# Patient Record
Sex: Male | Born: 1975 | Race: Black or African American | Hispanic: No
Health system: Southern US, Community
[De-identification: ages and names within clinical notes are randomized; demographics above are authoritative.]

## PROBLEM LIST (undated history)

## (undated) DIAGNOSIS — Z720 Tobacco use: Secondary | ICD-10-CM

## (undated) DIAGNOSIS — J45909 Unspecified asthma, uncomplicated: Secondary | ICD-10-CM

## (undated) DIAGNOSIS — I509 Heart failure, unspecified: Secondary | ICD-10-CM

---
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
## 2004-10-29 ENCOUNTER — Emergency Department (HOSPITAL_COMMUNITY): Admission: EM | Admit: 2004-10-29 | Discharge: 2004-10-29 | Payer: Self-pay | Admitting: Family Medicine

## 2005-03-16 ENCOUNTER — Emergency Department (HOSPITAL_COMMUNITY): Admission: EM | Admit: 2005-03-16 | Discharge: 2005-03-16 | Payer: Self-pay | Admitting: Family Medicine

## 2005-04-28 ENCOUNTER — Encounter: Admission: RE | Admit: 2005-04-28 | Discharge: 2005-04-28 | Payer: Self-pay | Admitting: Occupational Medicine

## 2019-08-18 ENCOUNTER — Ambulatory Visit: Payer: Self-pay | Attending: Internal Medicine

## 2019-08-18 DIAGNOSIS — Z20822 Contact with and (suspected) exposure to covid-19: Secondary | ICD-10-CM | POA: Insufficient documentation

## 2019-08-19 LAB — NOVEL CORONAVIRUS, NAA: SARS-CoV-2, NAA: NOT DETECTED

## 2019-12-27 ENCOUNTER — Other Ambulatory Visit: Payer: Self-pay

## 2019-12-27 ENCOUNTER — Encounter (HOSPITAL_COMMUNITY): Payer: Self-pay

## 2019-12-27 ENCOUNTER — Inpatient Hospital Stay (HOSPITAL_COMMUNITY)
Admission: AD | Admit: 2019-12-27 | Discharge: 2019-12-30 | DRG: 871 | Disposition: A | Discharge status: Home / Self Care | Payer: 59 | Attending: Internal Medicine | Admitting: Internal Medicine

## 2019-12-27 ENCOUNTER — Emergency Department (HOSPITAL_COMMUNITY): Payer: 59

## 2019-12-27 DIAGNOSIS — R61 Generalized hyperhidrosis: Secondary | ICD-10-CM | POA: Diagnosis present

## 2019-12-27 DIAGNOSIS — Z20822 Contact with and (suspected) exposure to covid-19: Secondary | ICD-10-CM | POA: Diagnosis present

## 2019-12-27 DIAGNOSIS — I10 Essential (primary) hypertension: Secondary | ICD-10-CM | POA: Diagnosis not present

## 2019-12-27 DIAGNOSIS — J189 Pneumonia, unspecified organism: Secondary | ICD-10-CM | POA: Diagnosis present

## 2019-12-27 DIAGNOSIS — Z8249 Family history of ischemic heart disease and other diseases of the circulatory system: Secondary | ICD-10-CM

## 2019-12-27 DIAGNOSIS — F17210 Nicotine dependence, cigarettes, uncomplicated: Secondary | ICD-10-CM | POA: Diagnosis present

## 2019-12-27 DIAGNOSIS — R0602 Shortness of breath: Secondary | ICD-10-CM

## 2019-12-27 DIAGNOSIS — J45901 Unspecified asthma with (acute) exacerbation: Secondary | ICD-10-CM | POA: Diagnosis present

## 2019-12-27 DIAGNOSIS — Z7289 Other problems related to lifestyle: Secondary | ICD-10-CM | POA: Diagnosis not present

## 2019-12-27 DIAGNOSIS — I361 Nonrheumatic tricuspid (valve) insufficiency: Secondary | ICD-10-CM | POA: Diagnosis not present

## 2019-12-27 DIAGNOSIS — R030 Elevated blood-pressure reading, without diagnosis of hypertension: Secondary | ICD-10-CM | POA: Diagnosis present

## 2019-12-27 DIAGNOSIS — I428 Other cardiomyopathies: Secondary | ICD-10-CM | POA: Diagnosis present

## 2019-12-27 DIAGNOSIS — Z825 Family history of asthma and other chronic lower respiratory diseases: Secondary | ICD-10-CM | POA: Diagnosis not present

## 2019-12-27 DIAGNOSIS — I5042 Chronic combined systolic (congestive) and diastolic (congestive) heart failure: Secondary | ICD-10-CM

## 2019-12-27 DIAGNOSIS — I5041 Acute combined systolic (congestive) and diastolic (congestive) heart failure: Secondary | ICD-10-CM | POA: Diagnosis present

## 2019-12-27 DIAGNOSIS — I351 Nonrheumatic aortic (valve) insufficiency: Secondary | ICD-10-CM | POA: Diagnosis not present

## 2019-12-27 DIAGNOSIS — E785 Hyperlipidemia, unspecified: Secondary | ICD-10-CM | POA: Diagnosis present

## 2019-12-27 DIAGNOSIS — J45909 Unspecified asthma, uncomplicated: Secondary | ICD-10-CM

## 2019-12-27 DIAGNOSIS — A419 Sepsis, unspecified organism: Secondary | ICD-10-CM | POA: Diagnosis present

## 2019-12-27 HISTORY — DX: Unspecified asthma, uncomplicated: J45.909

## 2019-12-27 LAB — CBC WITH DIFFERENTIAL/PLATELET
Abs Immature Granulocytes: 0.04 10*3/uL (ref 0.00–0.07)
Basophils Absolute: 0 10*3/uL (ref 0.0–0.1)
Basophils Relative: 0 %
Eosinophils Absolute: 0.1 10*3/uL (ref 0.0–0.5)
Eosinophils Relative: 1 %
HCT: 43.6 % (ref 39.0–52.0)
Hemoglobin: 14.9 g/dL (ref 13.0–17.0)
Immature Granulocytes: 0 %
Lymphocytes Relative: 11 %
Lymphs Abs: 1.2 10*3/uL (ref 0.7–4.0)
MCH: 32.1 pg (ref 26.0–34.0)
MCHC: 34.2 g/dL (ref 30.0–36.0)
MCV: 94 fL (ref 80.0–100.0)
Monocytes Absolute: 0.9 10*3/uL (ref 0.1–1.0)
Monocytes Relative: 8 %
Neutro Abs: 9 10*3/uL — ABNORMAL HIGH (ref 1.7–7.7)
Neutrophils Relative %: 80 %
Platelets: 195 10*3/uL (ref 150–400)
RBC: 4.64 MIL/uL (ref 4.22–5.81)
RDW: 11.7 % (ref 11.5–15.5)
WBC: 11.4 10*3/uL — ABNORMAL HIGH (ref 4.0–10.5)
nRBC: 0 % (ref 0.0–0.2)

## 2019-12-27 LAB — C-REACTIVE PROTEIN: CRP: 9.2 mg/dL — ABNORMAL HIGH (ref <1.0)

## 2019-12-27 LAB — BRAIN NATRIURETIC PEPTIDE: B Natriuretic Peptide: 175.3 pg/mL — ABNORMAL HIGH (ref 0.0–100.0)

## 2019-12-27 LAB — RAPID URINE DRUG SCREEN, HOSP PERFORMED
Amphetamines: NOT DETECTED
Barbiturates: NOT DETECTED
Benzodiazepines: NOT DETECTED
Cocaine: NOT DETECTED
Opiates: NOT DETECTED
Tetrahydrocannabinol: NOT DETECTED

## 2019-12-27 LAB — COMPREHENSIVE METABOLIC PANEL
ALT: 29 U/L (ref 0–44)
AST: 22 U/L (ref 15–41)
Albumin: 4.1 g/dL (ref 3.5–5.0)
Alkaline Phosphatase: 50 U/L (ref 38–126)
Anion gap: 10 (ref 5–15)
BUN: 12 mg/dL (ref 6–20)
CO2: 22 mmol/L (ref 22–32)
Calcium: 8.7 mg/dL — ABNORMAL LOW (ref 8.9–10.3)
Chloride: 101 mmol/L (ref 98–111)
Creatinine, Ser: 0.99 mg/dL (ref 0.61–1.24)
GFR calc Af Amer: >60 mL/min (ref >60)
GFR calc non Af Amer: >60 mL/min (ref >60)
Glucose, Bld: 108 mg/dL — ABNORMAL HIGH (ref 70–99)
Potassium: 3.8 mmol/L (ref 3.5–5.1)
Sodium: 133 mmol/L — ABNORMAL LOW (ref 135–145)
Total Bilirubin: 1.2 mg/dL (ref 0.3–1.2)
Total Protein: 7.6 g/dL (ref 6.5–8.1)

## 2019-12-27 LAB — HIV ANTIBODY (ROUTINE TESTING W REFLEX): HIV Screen 4th Generation wRfx: NONREACTIVE

## 2019-12-27 LAB — LACTIC ACID, PLASMA
Lactic Acid, Venous: 1.1 mmol/L (ref 0.5–1.9)
Lactic Acid, Venous: 0.9 mmol/L (ref 0.5–1.9)

## 2019-12-27 LAB — SARS CORONAVIRUS 2 BY RT PCR (HOSPITAL ORDER, PERFORMED IN ~~LOC~~ HOSPITAL LAB): SARS Coronavirus 2: NEGATIVE

## 2019-12-27 LAB — FERRITIN: Ferritin: 327 ng/mL (ref 24–336)

## 2019-12-27 LAB — LACTATE DEHYDROGENASE: LDH: 163 U/L (ref 98–192)

## 2019-12-27 LAB — PROTIME-INR
INR: 1.1 (ref 0.8–1.2)
Prothrombin Time: 13.6 seconds (ref 11.4–15.2)

## 2019-12-27 LAB — PROCALCITONIN: Procalcitonin: 0.12 ng/mL

## 2019-12-27 LAB — FIBRINOGEN: Fibrinogen: 477 mg/dL — ABNORMAL HIGH (ref 210–475)

## 2019-12-27 LAB — TRIGLYCERIDES: Triglycerides: 70 mg/dL (ref <150)

## 2019-12-27 LAB — D-DIMER, QUANTITATIVE: D-Dimer, Quant: 0.37 ug/mL-FEU (ref 0.00–0.50)

## 2019-12-27 MED ORDER — ACETAMINOPHEN 500 MG PO TABS
1000.0000 mg | ORAL_TABLET | Freq: Once | ORAL | Status: AC
  Administered 2019-12-27: 1000 mg via ORAL
  Filled 2019-12-27: qty 2

## 2019-12-27 MED ORDER — ALBUTEROL SULFATE (2.5 MG/3ML) 0.083% IN NEBU
2.5000 mg | INHALATION_SOLUTION | Freq: Four times a day (QID) | RESPIRATORY_TRACT | Status: DC
  Administered 2019-12-27 – 2019-12-28 (×4): 2.5 mg via RESPIRATORY_TRACT
  Filled 2019-12-27 (×4): qty 3

## 2019-12-27 MED ORDER — LACTATED RINGERS IV SOLN: INTRAVENOUS | Status: DC

## 2019-12-27 MED ORDER — ENOXAPARIN SODIUM 40 MG/0.4ML ~~LOC~~ SOLN
40.0000 mg | SUBCUTANEOUS | Status: DC
  Administered 2019-12-27 – 2019-12-29 (×3): 40 mg via SUBCUTANEOUS
  Filled 2019-12-27 (×3): qty 0.4

## 2019-12-27 MED ORDER — SODIUM CHLORIDE 0.9 % IV SOLN
1.0000 g | INTRAVENOUS | Status: DC
  Administered 2019-12-28 – 2019-12-30 (×3): 1 g via INTRAVENOUS
  Filled 2019-12-27 (×3): qty 1

## 2019-12-27 MED ORDER — SODIUM CHLORIDE 0.9 % IV SOLN
1.0000 g | Freq: Once | INTRAVENOUS | Status: AC
  Administered 2019-12-27: 1 g via INTRAVENOUS
  Filled 2019-12-27: qty 10

## 2019-12-27 MED ORDER — ALBUTEROL SULFATE HFA 108 (90 BASE) MCG/ACT IN AERS
4.0000 | INHALATION_SPRAY | Freq: Once | RESPIRATORY_TRACT | Status: AC
Start: 2019-12-27 — End: 2019-12-27
  Administered 2019-12-27: 4 via RESPIRATORY_TRACT
  Filled 2019-12-27: qty 6.7

## 2019-12-27 MED ORDER — PREDNISONE 20 MG PO TABS
60.0000 mg | ORAL_TABLET | Freq: Once | ORAL | Status: AC
  Administered 2019-12-27: 60 mg via ORAL
  Filled 2019-12-27: qty 3

## 2019-12-27 MED ORDER — PREDNISONE 20 MG PO TABS: 60.0000 mg | ORAL_TABLET | Freq: Every day | ORAL | Status: DC

## 2019-12-27 MED ORDER — CHLORHEXIDINE GLUCONATE CLOTH 2 % EX PADS
6.0000 | MEDICATED_PAD | Freq: Every day | CUTANEOUS | Status: DC
  Administered 2019-12-30: 6 via TOPICAL

## 2019-12-27 MED ORDER — SODIUM CHLORIDE 0.9 % IV SOLN
500.0000 mg | INTRAVENOUS | Status: DC
  Filled 2019-12-27: qty 500

## 2019-12-27 MED ORDER — SODIUM CHLORIDE 0.9 % IV SOLN
500.0000 mg | Freq: Once | INTRAVENOUS | Status: AC
  Administered 2019-12-27: 500 mg via INTRAVENOUS
  Filled 2019-12-27: qty 500

## 2019-12-27 MED ORDER — PREDNISONE 20 MG PO TABS
40.0000 mg | ORAL_TABLET | Freq: Every day | ORAL | Status: DC
  Administered 2019-12-28: 40 mg via ORAL
  Filled 2019-12-27: qty 2

## 2019-12-27 NOTE — ED Notes (Signed)
ED TO INPATIENT HANDOFF REPORT  ED Nurse Name and Phone #: [phone]  S Name/Age/Gender Alex Lee 44 y.o. male Room/Bed: WA25/WA25  Code Status   Code Status: Not on file  Home/SNF/Other Home Patient oriented to: self, place, time and situation Is this baseline? Yes   Triage Complete: Triage complete  Chief Complaint Community acquired pneumonia [J18.9]  Triage Note Pt reports a cough that started the day before yesterday. He states that it is causing his asthma to "act up." Also reports chest pain and states that if he lays on his R side, he has increased difficulty breathing.     Allergies No Known Allergies  Level of Care/Admitting Diagnosis ED Disposition    ED Disposition Condition Comment   Admit  Hospital Area: [facility] [[id number]]  Level of Care: Stepdown [14]  Admit to SDU based on following criteria: Respiratory Distress:  Frequent assessment and/or intervention to maintain adequate ventilation/respiration, pulmonary toilet, and respiratory treatment.  May [facility] patient to [facility] or [facility] if equivalent level of care is available:: Yes  Covid Evaluation: Confirmed COVID Negative  Admission Type: Urgent [2]  Diagnosis: Community acquired pneumonia [967591]  Admitting Physician: Sueanne Margarita [[id number]]  Attending Physician: Sueanne Margarita [[id number]]  Estimated length of stay: past midnight tomorrow  Certification:: I certify this patient will need inpatient services for at least 2 midnights       B Medical/Surgery History History reviewed. No pertinent past medical history.    A IV Location/Drains/Wounds Patient Lines/Drains/Airways Status    Active Line/Drains/Airways    Name Placement date Placement time Site Days   Peripheral IV 12/27/19 Left Antecubital 12/27/19  0850  Antecubital  less than 1   Peripheral IV 12/27/19 Left Hand 12/27/19  0857  Hand  less than 1          Intake/Output Last 24 hours No intake or  output data in the 24 hours ending 12/27/19 1411  Labs/Imaging Results for orders placed or performed during the hospital encounter of 12/27/19 (from the past 48 hour(s))  SARS Coronavirus 2 by RT PCR (hospital order, performed in [place] hospital lab) Nasopharyngeal Nasopharyngeal Swab     Status: None   Collection Time: 12/27/19  8:46 AM   Specimen: Nasopharyngeal Swab  Result Value Ref Range   SARS Coronavirus 2 NEGATIVE NEGATIVE    Comment: (NOTE) SARS-CoV-2 target nucleic acids are NOT DETECTED.  The SARS-CoV-2 RNA is generally detectable in upper and lower respiratory specimens during the acute phase of infection. The lowest concentration of SARS-CoV-2 viral copies this assay can detect is 250 copies / mL. A negative result does not preclude SARS-CoV-2 infection and should not be used as the sole basis for treatment or other patient management decisions.  A negative result may occur with improper specimen collection / handling, submission of specimen other than nasopharyngeal swab, presence of viral mutation(s) within the areas targeted by this assay, and inadequate number of viral copies (<250 copies / mL). A negative result must be combined with clinical observations, patient history, and epidemiological information.  Fact Sheet for Patients:   StrictlyIdeas.no  Fact Sheet for Healthcare Providers: BankingDealers.co.za  This test is not yet approved or  cleared by the [place] FDA and has been authorized for detection and/or diagnosis of SARS-CoV-2 by FDA under an Emergency Use Authorization (EUA).  This EUA will remain in effect (meaning this test can be used) for the duration of the COVID-19 declaration under Section 564(b)(1) of  the Act, 21 U.S.C. section 360bbb-3(b)(1), unless the authorization is terminated or revoked sooner.  Performed at [facility], [address], [place], [place]  [address]   Lactic acid, plasma     Status: None   Collection Time: 12/27/19  8:46 AM  Result Value Ref Range   Lactic Acid, Venous 1.1 0.5 - 1.9 mmol/L    Comment: Performed at [facility], [address], [place], [place] [address]  CBC WITH DIFFERENTIAL     Status: Abnormal   Collection Time: 12/27/19  8:46 AM  Result Value Ref Range   WBC 11.4 (H) 4.0 - 10.5 K/uL   RBC 4.64 4.22 - 5.81 MIL/uL   Hemoglobin 14.9 13.0 - 17.0 g/dL   HCT 50.0 39 - 52 %   MCV 94.0 80.0 - 100.0 fL   MCH 32.1 26.0 - 34.0 pg   MCHC 34.2 30.0 - 36.0 g/dL   RDW 93.8 18.2 - 99.3 %   Platelets 195 150 - 400 K/uL   nRBC 0.0 0.0 - 0.2 %   Neutrophils Relative % 80 %   Neutro Abs 9.0 (H) 1.7 - 7.7 K/uL   Lymphocytes Relative 11 %   Lymphs Abs 1.2 0.7 - 4.0 K/uL   Monocytes Relative 8 %   Monocytes Absolute 0.9 0 - 1 K/uL   Eosinophils Relative 1 %   Eosinophils Absolute 0.1 0 - 0 K/uL   Basophils Relative 0 %   Basophils Absolute 0.0 0 - 0 K/uL   Immature Granulocytes 0 %   Abs Immature Granulocytes 0.04 0.00 - 0.07 K/uL    Comment: Performed at [facility], [address], [place], [place] [address]  Comprehensive metabolic panel     Status: Abnormal   Collection Time: 12/27/19  8:46 AM  Result Value Ref Range   Sodium 133 (L) 135 - 145 mmol/L   Potassium 3.8 3.5 - 5.1 mmol/L   Chloride 101 98 - 111 mmol/L   CO2 22 22 - 32 mmol/L   Glucose, Bld 108 (H) 70 - 99 mg/dL    Comment: Glucose reference range applies only to samples taken after fasting for at least 8 hours.   BUN 12 6 - 20 mg/dL   Creatinine, Ser 7.89 0.61 - 1.24 mg/dL   Calcium 8.7 (L) 8.9 - 10.3 mg/dL   Total Protein 7.6 6.5 - 8.1 g/dL   Albumin 4.1 3.5 - 5.0 g/dL   AST 22 15 - 41 U/L   ALT 29 0 - 44 U/L   Alkaline Phosphatase 50 38 - 126 U/L   Total Bilirubin 1.2 0.3 - 1.2 mg/dL   GFR calc non Af Amer >60 >60 mL/min   GFR calc Af Amer >60 >60 mL/min   Anion gap 10 5 - 15    Comment: Performed  at [facility], [address] [place], [place] [address]  D-dimer, quantitative     Status: None   Collection Time: 12/27/19  8:46 AM  Result Value Ref Range   D-Dimer, Quant 0.37 0.00 - 0.50 ug/mL-FEU    Comment: (NOTE) At the manufacturer cut-off of 0.50 ug/mL FEU, this assay has been documented to exclude PE with a sensitivity and negative predictive value of 97 to 99%.  At this time, this assay has not been approved by the FDA to exclude DVT/VTE. Results should be correlated with clinical presentation. Performed at [facility], [address] [place], [place] [address]  Procalcitonin     Status: None   Collection Time: 12/27/19  8:46 AM  Result Value Ref Range   Procalcitonin 0.12 ng/mL    Comment:        Interpretation: PCT (Procalcitonin) <= 0.5 ng/mL: Systemic infection (sepsis) is not likely. Local bacterial infection is possible. (NOTE)       Sepsis PCT Algorithm           Lower Respiratory Tract                                      Infection PCT Algorithm    ----------------------------     ----------------------------         PCT < 0.25 ng/mL                PCT < 0.10 ng/mL          Strongly encourage             Strongly discourage   discontinuation of antibiotics    initiation of antibiotics    ----------------------------     -----------------------------       PCT 0.25 - 0.50 ng/mL            PCT 0.10 - 0.25 ng/mL               OR       >80% decrease in PCT            Discourage initiation of                                            antibiotics      Encourage discontinuation           of antibiotics    ----------------------------     -----------------------------         PCT >= 0.50 ng/mL              PCT 0.26 - 0.50 ng/mL               AND        <80% decrease in PCT             Encourage initiation of                                             antibiotics       Encourage continuation           of  antibiotics    ----------------------------     -----------------------------        PCT >= 0.50 ng/mL                  PCT > 0.50 ng/mL               AND         increase in PCT                  Strongly encourage                                      initiation  of antibiotics    Strongly encourage escalation           of antibiotics                                     -----------------------------                                           PCT <= 0.25 ng/mL                                                 OR                                        > 80% decrease in PCT                                      Discontinue / Do not initiate                                             antibiotics  Performed at [facility], [address] [place], [place] [address]   Lactate dehydrogenase     Status: None   Collection Time: 12/27/19  8:46 AM  Result Value Ref Range   LDH 163 98 - 192 U/L    Comment: Performed at [facility], [address], [place], [place] [address]  Triglycerides     Status: None   Collection Time: 12/27/19  8:46 AM  Result Value Ref Range   Triglycerides 70 <150 mg/dL    Comment: Performed at [facility], [address], [place], [place] [address]  Fibrinogen     Status: Abnormal   Collection Time: 12/27/19  8:46 AM  Result Value Ref Range   Fibrinogen 477 (H) 210 - 475 mg/dL    Comment: Performed at [facility], [address] [place], [place] [address]  Lactic acid, plasma     Status: None   Collection Time: 12/27/19 10:13 AM  Result Value Ref Range   Lactic Acid, Venous 0.9 0.5 - 1.9 mmol/L    Comment: Performed at [facility], [address] [place], [place] [address]   DG Chest 2 View  Result Date: 12/27/2019 CLINICAL DATA:  Short of breath.  Cough EXAM: CHEST - 2 VIEW COMPARISON:  None. FINDINGS: Normal cardiac silhouette. There is diffuse airspace density  in the LEFT upper lobe. RIGHT lung clear. Lung bases clear. IMPRESSION: Concern for LEFT upper lobe pneumonia versus asymmetric pulmonary edema. Electronically Signed   By: Genevive Bi M.D.   On: 12/27/2019 08:11    Pending Labs Unresulted Labs (From admission, onward) Comment          Start     Ordered   12/27/19 1257  Brain natriuretic peptide  Add-on,   AD  12/27/19 1256   12/27/19 1239  Expectorated sputum assessment w rflx to resp cult  Once,   R        12/27/19 1238   12/27/19 0813  Blood Culture (routine x 2)  BLOOD CULTURE X 2,   STAT      12/27/19 0813   12/27/19 0813  Ferritin  Once,   STAT        12/27/19 0813   12/27/19 0813  C-reactive protein  Once,   STAT        12/27/19 0813   Signed and Held  HIV Antibody (routine testing w rflx)  (HIV Antibody (Routine testing w reflex) panel)  Once,   R        Signed and Held   Signed and Held  Urine rapid drug screen (hosp performed)  ONCE - STAT,   R        Signed and Held   Signed and Held  Protime-INR  Once,   R        Signed and Held   Signed and Held  Influenza panel by PCR (type A & B)  (Influenza PCR Panel)  Once,   R        Signed and Held          Vitals/Pain Today's Vitals   12/27/19 1147 12/27/19 1202 12/27/19 1217 12/27/19 1232  BP:  (!) 141/110    Pulse: (!) 106 (!) 104 (!) 108 (!) 105  Resp: (!) 24 20 16  (!) 23  Temp:      TempSrc:      SpO2: 96% 96% 96% 97%    Isolation Precautions Airborne and Contact precautions  Medications Medications  albuterol (VENTOLIN HFA) 108 (90 Base) MCG/ACT inhaler 4 puff (4 puffs Inhalation Given 12/27/19 0906)  acetaminophen (TYLENOL) tablet 1,000 mg (1,000 mg Oral Given 12/27/19 0906)  cefTRIAXone (ROCEPHIN) 1 g in sodium chloride 0.9 % 100 mL IVPB (0 g Intravenous Stopped 12/27/19 1058)  azithromycin (ZITHROMAX) 500 mg in sodium chloride 0.9 % 250 mL IVPB (0 mg Intravenous Stopped 12/27/19 1232)  predniSONE (DELTASONE) tablet 60 mg (60 mg Oral Given 12/27/19  1101)    Mobility walks Low fall risk   Focused Assessments .   R Recommendations: See Admitting Provider Note  Report given to:   Additional Notes: n/a

## 2019-12-27 NOTE — H&P (Signed)
History and Physical    Alex Lee WER:[id number] DOB: [birth year] DOA: 12/27/2019  PCP: Patient, No Pcp Per has not established but will move to [employer] around [place] and estbalish with someone Patient coming from: home, lives with wife  Chief Complaint: SOB  HPI: Alex Lee is a 44 y.o. male with a pertinent history of childhood asthma who presents to [facility] ED with shortness of breath.  Pt states that for the past 2 days he has been ahving shortness of breath, yesterday felt like his previous childhood asthma.  He was so SOB he called EMS and they provided a breathing treatment which made him feel better for a short period of time but it returned and caused him to present to the ED.  Associated symptoms include fever.    SH - patient works on [employer] but denies any known exposures to cause this.  Denies any sick contacts.  He still smokes.  Denies any other family history of lung or liver diseases like alpha1antitrypsin  In the ED, he was tachycardic especially with ambulation, tachypneic, febrile, wbc 11.4, Na 133, Scr 0.99, LFT's okay, procal 1.1, LA 1.1, covid negative.  CXR with LUL opacity concerning for PNA.  CXR shows Concern for LEFT upper lobe pneumonia versus asymmetric pulmonary edema. ekg started on prednisone 60mg , azithromycin and ceftriaxone and albuterol which the ED provider thinks helped some.  s/p blood cultures  Talked with wife in the room.  has not been vaccinated against covid.  Review of Systems: As per HPI otherwise 10 point review of systems negative.  Other pertinents as below:  General - denies any new HA's or visual changes, weight change HEENT - Cardio -  Denies any CP or palpitations Resp - denies any sick contacts, denies orthopnea or LE edema GI - denies any n/v/d/GI pain GU - denies urinary changes MSK - denies MSK changes Skin - denies new skin changes or infections Neuro - no new numbness or weakness Psych - denies any new anxiety or  depression  Past Medical History:  Diagnosis Date  . Childhood asthma   . Tobacco abuse     History reviewed. No pertinent surgical history.   reports that he has been smoking. He does not have any smokeless tobacco history on file. He reports previous drug use. No history on file for alcohol use.  No Known Allergies  History reviewed. No pertinent family history. Has FH of asthma  Prior to Admission medications   Medication Sig Start Date End Date Taking? Authorizing Provider  cephALEXin (KEFLEX) 250 MG capsule Take 250 mg by mouth in the morning and at bedtime. 12/10/19  Yes [provider]    Physical Exam: Vitals:   12/27/19 1500 12/27/19 1524 12/27/19 1600 12/27/19 2000  BP: (!) 171/122  (!) 168/113   Pulse: 97  98   Resp: 20  (!) 22   Temp:   98.8 F (37.1 C) 98.5 F (36.9 C)  TempSrc:   Oral Oral  SpO2: 97% 97% 96%     Constitutional: NAD, comfortable, a little diaphoretic Eyes: pupils equal and reactive to light, anicteric, without injection ENMT: MMM, throat without exudates or erythema Neck: normal, supple, no masses, no thyromegaly noted Respiratory: LUL diminished breath sounds, increased work of breathing some, some wheezing heard Cardiovascular: rrr, well perfused in the extremities Abdomen: NBS, NT,  Obese some Musculoskeletal: moving all 4 extremities, strength grossly intact 5/5 in the UE and LE's Skin: no rashes, lesions, ulcers. No induration Neurologic:  CN 2-12 grossly intact. Sensation intact Psychiatric: AO appearing, mentation appropriate  Labs on Admission: I have personally reviewed following labs and imaging studies  CBC: Recent Labs  Lab 12/27/19 0846  WBC 11.4*  NEUTROABS 9.0*  HGB 14.9  HCT 43.6  MCV 94.0  PLT 195   Basic Metabolic Panel: Recent Labs  Lab 12/27/19 0846  NA 133*  K 3.8  CL 101  CO2 22  GLUCOSE 108*  BUN 12  CREATININE 0.99  CALCIUM 8.7*   GFR: CrCl cannot be calculated (Unknown ideal  weight.). Liver Function Tests: Recent Labs  Lab 12/27/19 0846  AST 22  ALT 29  ALKPHOS 50  BILITOT 1.2  PROT 7.6  ALBUMIN 4.1   No results for input(s): LIPASE, AMYLASE in the last 168 hours. No results for input(s): AMMONIA in the last 168 hours. Coagulation Profile: Recent Labs  Lab 12/27/19 1527  INR 1.1   Cardiac Enzymes: No results for input(s): CKTOTAL, CKMB, CKMBINDEX, TROPONINI in the last 168 hours. BNP (last 3 results) No results for input(s): PROBNP in the last 8760 hours. HbA1C: No results for input(s): HGBA1C in the last 72 hours. CBG: No results for input(s): GLUCAP in the last 168 hours. Lipid Profile: Recent Labs    12/27/19 0846  TRIG 70   Thyroid Function Tests: No results for input(s): TSH, T4TOTAL, FREET4, T3FREE, THYROIDAB in the last 72 hours. Anemia Panel: Recent Labs    12/27/19 0846  FERRITIN 327   Urine analysis: No results found for: COLORURINE, APPEARANCEUR, LABSPEC, PHURINE, GLUCOSEU, HGBUR, BILIRUBINUR, KETONESUR, PROTEINUR, UROBILINOGEN, NITRITE, LEUKOCYTESUR  Radiological Exams on Admission: DG Chest 2 View  Result Date: 12/27/2019 CLINICAL DATA:  Short of breath.  Cough EXAM: CHEST - 2 VIEW COMPARISON:  None. FINDINGS: Normal cardiac silhouette. There is diffuse airspace density in the LEFT upper lobe. RIGHT lung clear. Lung bases clear. IMPRESSION: Concern for LEFT upper lobe pneumonia versus asymmetric pulmonary edema. Electronically Signed   By: Genevive Bi M.D.   On: 12/27/2019 08:11    EKG: Independently reviewed.   Assessment/Plan Active Problems:   Childhood asthma   Community acquired pneumonia   Sepsis, fever, tachycardia, leukocytosis, increased respiratory rate to 31 from a CAP with possibly associated asthma exacerbation.  Doubt COVID given 3 days of symptoms and negative PCR test.  Albuterol helped some.   DDX: vocal cord dysfunctoin/spasm? cbc with diff --PE? ARDS?, consider if no improvement.  Will  look for RH strain on echo as below, but really doubt --Continue azithromycin and ceftriaxone, QTc is 452 --started on prednisone 40mg , continue for 5 days --scheduled albuterol for every 6 hours --UDS ordered, negative, cocaine (he denies) --follow up cultures  --LR 108mL/hr for 14 hours, to end ~4AM --test for flu --pericardial effusion, not on bedside ultrasound but with pwave elevation and BNP to 175 will get an echocardiogram.  No notable JVD  Elevated blood pressure, has not established with a PCP, could be hypertension, consider starting amlodipine or HCTZ on discharge.  If no thoughts for pulmonary edema  Patient and/or Family completely agreed with the plan, expressed understanding and I answered all questions.  DVT prophylaxis: Lovenox SQ Code Status: Full code Family Communication: Mrs. Doleman in the room Disposition Plan: home when better on oral abx probably  Consults called: n/a Admission status: Probably can move to floor tomorrow, stepdown currently because of severe findings with his PNA and initial appearance.   A total of 80 minutes utilized during this admission.  72m  [place] DO [facility]   If 7PM-7AM, please contact night-coverage [email or web address] Password [facility]  12/27/2019, 8:14 PM

## 2019-12-27 NOTE — ED Triage Notes (Signed)
Pt reports a cough that started the day before yesterday. He states that it is causing his asthma to "act up." Also reports chest pain and states that if he lays on his R side, he has increased difficulty breathing.

## 2019-12-27 NOTE — ED Provider Notes (Signed)
[facility] Note   CSN: [id number] Arrival date & time: 12/27/19  5809     History Chief Complaint  Patient presents with  . Cough  . Chest Pain    Alex Lee is a 43 y.o. male.  Patient is a 44 year old male who presents with cough and shortness of breath.  He reports a 2-day history of runny nose congestion and coughing.  His cough is mostly nonproductive.  He has had some associated shortness of breath.  He has a remote history of asthma when he was a child but has not had issues since that time.  He has been feeling wheezy and short of breath since yesterday.  He does have some pain across the center of his chest.  Its otherwise nonradiating.  He says it is worse with coughing.  He has had some fevers that started yesterday.  No vomiting or diarrhea.  No leg swelling.  No known Covid exposures.  He has not been vaccinated for Covid.  He said that EMS came out yesterday to his house and gave him a breathing treatment and he felt much better and did not want to be transported at that time.  However he has no inhalers or any other medications to use for his asthma at home as he has not had issues with it since he was a child.        History reviewed. No pertinent past medical history.  Patient Active Problem List   Diagnosis Date Noted  . Childhood asthma 12/27/2019  . Community acquired pneumonia 12/27/2019     The histories are not reviewed yet. Please review them in the "History" navigator section and refresh this SmartLink.     History reviewed. No pertinent family history.  Social History   Tobacco Use  . Smoking status: Not on file  Substance Use Topics  . Alcohol use: Not on file  . Drug use: Not on file    Home Medications Prior to Admission medications   Medication Sig Start Date End Date Taking? Authorizing Provider  cephALEXin (KEFLEX) 250 MG capsule Take 250 mg by mouth in the morning and at bedtime. 12/10/19   Yes [provider]    Allergies    Patient has no known allergies.  Review of Systems   Review of Systems  Constitutional: Positive for fatigue. Negative for chills, diaphoresis and fever.  HENT: Positive for congestion and rhinorrhea. Negative for sneezing.   Eyes: Negative.   Respiratory: Positive for cough, chest tightness and shortness of breath.   Cardiovascular: Negative for chest pain and leg swelling.  Gastrointestinal: Negative for abdominal pain, blood in stool, diarrhea, nausea and vomiting.  Genitourinary: Negative for difficulty urinating, flank pain, frequency and hematuria.  Musculoskeletal: Negative for arthralgias and back pain.  Skin: Negative for rash.  Neurological: Negative for dizziness, speech difficulty, weakness, numbness and headaches.    Physical Exam Updated Vital Signs BP (!) 141/110   Pulse (!) 105   Temp 99 F (37.2 C) (Oral)   Resp (!) 23   SpO2 97%   Physical Exam Constitutional:      Appearance: He is well-developed.  HENT:     Head: Normocephalic and atraumatic.  Eyes:     Pupils: Pupils are equal, round, and reactive to light.  Cardiovascular:     Rate and Rhythm: Regular rhythm. Tachycardia present.     Heart sounds: Normal heart sounds.  Pulmonary:     Effort: Pulmonary effort is  normal. No respiratory distress.     Breath sounds: Wheezing and rhonchi present. No rales.  Chest:     Chest wall: No tenderness.  Abdominal:     General: Bowel sounds are normal.     Palpations: Abdomen is soft.     Tenderness: There is no abdominal tenderness. There is no guarding or rebound.  Musculoskeletal:        General: Normal range of motion.     Cervical back: Normal range of motion and neck supple.     Comments: No edema or calf tenderness  Lymphadenopathy:     Cervical: No cervical adenopathy.  Skin:    General: Skin is warm and dry.     Findings: No rash.  Neurological:     Mental Status: He is alert and oriented to  person, place, and time.     ED Results / Procedures / Treatments   Labs (all labs ordered are listed, but only abnormal results are displayed) Labs Reviewed  CBC WITH DIFFERENTIAL/PLATELET - Abnormal; Notable for the following components:      Result Value   WBC 11.4 (*)    Neutro Abs 9.0 (*)    All other components within normal limits  COMPREHENSIVE METABOLIC PANEL - Abnormal; Notable for the following components:   Sodium 133 (*)    Glucose, Bld 108 (*)    Calcium 8.7 (*)    All other components within normal limits  FIBRINOGEN - Abnormal; Notable for the following components:   Fibrinogen 477 (*)    All other components within normal limits  SARS CORONAVIRUS 2 BY RT PCR (HOSPITAL ORDER, PERFORMED IN [facility])  CULTURE, BLOOD (ROUTINE X 2)  CULTURE, BLOOD (ROUTINE X 2)  EXPECTORATED SPUTUM ASSESSMENT W REFEX TO RESP CULTURE  LACTIC ACID, PLASMA  D-DIMER, QUANTITATIVE (NOT AT [facility])  PROCALCITONIN  LACTATE DEHYDROGENASE  TRIGLYCERIDES  LACTIC ACID, PLASMA  FERRITIN  C-REACTIVE PROTEIN  BRAIN NATRIURETIC PEPTIDE    EKG EKG Interpretation  Date/Time:  Saturday December 27 2019 06:54:47 EDT Ventricular Rate:  112 PR Interval:    QRS Duration: 91 QT Interval:  331 QTC Calculation: 452 R Axis:   -55 Text Interpretation: Sinus tachycardia Probable left atrial enlargement Left anterior fascicular block RSR' in V1 or V2, right VCD or RVH Probable left ventricular hypertrophy Borderline T abnormalities, lateral leads ST elev, probable normal early repol pattern 12 Lead; Mason-Likar No old tracing to compare Confirmed by Rolan Bucco [phone]) on 12/27/2019 7:46:00 AM   Radiology DG Chest 2 View  Result Date: 12/27/2019 CLINICAL DATA:  Short of breath.  Cough EXAM: CHEST - 2 VIEW COMPARISON:  None. FINDINGS: Normal cardiac silhouette. There is diffuse airspace density in the LEFT upper lobe. RIGHT lung clear. Lung bases clear. IMPRESSION: Concern for LEFT upper  lobe pneumonia versus asymmetric pulmonary edema. Electronically Signed   By: Genevive Bi M.D.   On: 12/27/2019 08:11    Procedures Procedures (including critical care time)  Medications Ordered in ED Medications  albuterol (VENTOLIN HFA) 108 (90 Base) MCG/ACT inhaler 4 puff (4 puffs Inhalation Given 12/27/19 0906)  acetaminophen (TYLENOL) tablet 1,000 mg (1,000 mg Oral Given 12/27/19 0906)  cefTRIAXone (ROCEPHIN) 1 g in sodium chloride 0.9 % 100 mL IVPB (0 g Intravenous Stopped 12/27/19 1058)  azithromycin (ZITHROMAX) 500 mg in sodium chloride 0.9 % 250 mL IVPB (0 mg Intravenous Stopped 12/27/19 1232)  predniSONE (DELTASONE) tablet 60 mg (60 mg Oral Given 12/27/19 1101)  ED Course  I have reviewed the triage vital signs and the nursing notes.  Pertinent labs & imaging results that were available during my care of the patient were reviewed by me and considered in my medical decision making (see chart for details).    MDM Rules/Calculators/A&P                          Patient is a 44 year old male who presents with cough, wheezing and shortness of breath.  He has a remote history of asthma.  He is noted to be tachycardic with some mild increased work of breathing.  No hypoxia.  Chest x-ray shows left upper lobe pneumonia.  His Covid test is negative.  His other Covid markers are negative.  He remains mildly tachycardic although he feels better.  When he ambulates his heart rate goes up into the 120s and his sats dropped down to around 91.  He still has some wheezing and has some improvement with albuterol.  I feel his symptoms are most consistent with his pneumonia and reactive airway exacerbation.  He does not have any pleuritic type pain, unilateral leg swelling or other symptoms that sound more concerning for PE.  I did consult the hospitalist to admit the patient for further treatment.  He was given IV antibiotics in the ED as well as albuterol and steroids. Final Clinical Impression(s)  / ED Diagnoses Final diagnoses:  Community acquired pneumonia of left upper lobe of lung    Rx / DC Orders ED Discharge Orders    None       Malvin Johns, MD 12/27/19 1332

## 2019-12-27 NOTE — ED Notes (Signed)
Pt ambulated over 30 feet .   Pt maintained a steady gait.  Pt's O2 average from 91 to 93 Percent.   Pt's HR 125.

## 2019-12-27 NOTE — ED Notes (Signed)
Attempted report at 1409 but no body answer the phone.

## 2019-12-28 ENCOUNTER — Inpatient Hospital Stay (HOSPITAL_COMMUNITY): Payer: 59

## 2019-12-28 ENCOUNTER — Other Ambulatory Visit: Payer: Self-pay

## 2019-12-28 DIAGNOSIS — I351 Nonrheumatic aortic (valve) insufficiency: Secondary | ICD-10-CM

## 2019-12-28 DIAGNOSIS — J189 Pneumonia, unspecified organism: Secondary | ICD-10-CM

## 2019-12-28 DIAGNOSIS — I361 Nonrheumatic tricuspid (valve) insufficiency: Secondary | ICD-10-CM

## 2019-12-28 LAB — EXPECTORATED SPUTUM ASSESSMENT W GRAM STAIN, RFLX TO RESP C

## 2019-12-28 LAB — INFLUENZA PANEL BY PCR (TYPE A & B)
Influenza A By PCR: NEGATIVE
Influenza B By PCR: NEGATIVE

## 2019-12-28 LAB — ECHOCARDIOGRAM COMPLETE

## 2019-12-28 MED ORDER — ASPIRIN EC 81 MG PO TBEC
81.0000 mg | DELAYED_RELEASE_TABLET | Freq: Every day | ORAL | Status: DC
  Administered 2019-12-28 – 2019-12-30 (×3): 81 mg via ORAL
  Filled 2019-12-28 (×3): qty 1

## 2019-12-28 MED ORDER — PERFLUTREN LIPID MICROSPHERE
1.0000 mL | INTRAVENOUS | Status: AC | PRN
  Administered 2019-12-28: 2 mL via INTRAVENOUS
  Filled 2019-12-28: qty 10

## 2019-12-28 MED ORDER — HYDRALAZINE HCL 25 MG PO TABS
25.0000 mg | ORAL_TABLET | Freq: Four times a day (QID) | ORAL | Status: DC
  Administered 2019-12-28 – 2019-12-29 (×4): 25 mg via ORAL
  Filled 2019-12-28 (×4): qty 1

## 2019-12-28 MED ORDER — IPRATROPIUM-ALBUTEROL 0.5-2.5 (3) MG/3ML IN SOLN
3.0000 mL | Freq: Two times a day (BID) | RESPIRATORY_TRACT | Status: DC
  Administered 2019-12-28 – 2019-12-30 (×4): 3 mL via RESPIRATORY_TRACT
  Filled 2019-12-28 (×4): qty 3

## 2019-12-28 MED ORDER — DM-GUAIFENESIN ER 30-600 MG PO TB12
1.0000 | ORAL_TABLET | Freq: Two times a day (BID) | ORAL | Status: DC
  Administered 2019-12-28 – 2019-12-30 (×4): 1 via ORAL
  Filled 2019-12-28 (×4): qty 1

## 2019-12-28 MED ORDER — METHYLPREDNISOLONE SODIUM SUCC 40 MG IJ SOLR
40.0000 mg | Freq: Two times a day (BID) | INTRAMUSCULAR | Status: DC
  Administered 2019-12-28 – 2019-12-29 (×3): 40 mg via INTRAVENOUS
  Filled 2019-12-28 (×3): qty 1

## 2019-12-28 MED ORDER — ISOSORBIDE MONONITRATE ER 30 MG PO TB24
30.0000 mg | ORAL_TABLET | Freq: Every day | ORAL | Status: DC
  Administered 2019-12-28: 30 mg via ORAL
  Filled 2019-12-28: qty 1

## 2019-12-28 MED ORDER — AZITHROMYCIN 250 MG PO TABS
500.0000 mg | ORAL_TABLET | Freq: Every day | ORAL | Status: DC
  Administered 2019-12-28 – 2019-12-30 (×3): 500 mg via ORAL
  Filled 2019-12-28 (×3): qty 2

## 2019-12-28 MED ORDER — ALBUTEROL SULFATE (2.5 MG/3ML) 0.083% IN NEBU: 2.5000 mg | INHALATION_SOLUTION | RESPIRATORY_TRACT | Status: DC | PRN

## 2019-12-28 NOTE — Progress Notes (Signed)
[facility] Progress Note  Patient: Alex Lee    ALP:[id number]  DOA: 12/27/2019     Date of Service: the patient was seen and examined on 12/28/2019  Chief Complaint  Patient presents with  . Cough  . Chest Pain   Brief hospital course: Childhood asthma, active smoker.  Presents with complaints of shortness of breath and found to have acute asthma exacerbation Currently plan is continue current management.  Assessment and Plan: 1.  Acute asthma exacerbation ?  Sepsis secondary to community-acquired pneumonia POA. X-ray chest shows left upper lobe pneumonia fever, tachycardia and tachypnea on admission. Influenza PCR negative, SARS Covid negative. Continue with IV antibiotics. Follow-up on cultures. Continue nebulizer therapy. Change steroids from prednisone to IV Solu-Medrol. Add Mucinex  2.  Elevated BNP. Cardiomyopathy Echocardiogram performed on admission Shows 20 to 35% EF with diffuse hypokinesis We will consult cardiology tomorrow. N.p.o. after midnight. Add aspirin. Imdur. Patient denies having any chest pain or chest tightness.  No recent viral symptoms, no Covid illness, has not been vaccinated for Covid as well.  Diet:  DVT Prophylaxis: Subcutaneous Lovenox   Advance goals of care discussion: Full code  Family Communication: no family was present at bedside, at the time of interview.   Disposition:  Status is: Inpatient  Remains inpatient appropriate because:Ongoing diagnostic testing needed not appropriate for outpatient work up and IV treatments appropriate due to intensity of illness or inability to take PO   Dispo: The patient is from: Home              Anticipated d/c is to: Home              Anticipated d/c date is: 2 days              Patient currently is not medically stable to d/c.  Subjective: Continues to have shortness of breath and cough.  No fever no chills.  No recent viral symptoms.  No chest pain or chest tightness.  Physical  Exam:  General: Appear in mild distress, no Rash; Oral Mucosa Clear, moist. no Abnormal Neck Mass Or lumps, Conjunctiva normal  Cardiovascular: S1 and S2 Present, no Murmur, Respiratory: increased respiratory effort, Bilateral Air entry present and bilateral Crackles, bilateral  wheezes Abdomen: Bowel Sound present, Soft and no tenderness Extremities: no Pedal edema, no calf tenderness Neurology: alert and oriented to time, place, and person affect appropriate. no new focal deficit Gait not checked due to patient safety concerns  Vitals:   12/28/19 0724 12/28/19 0850 12/28/19 0918 12/28/19 1352  BP:   (!) 153/110 (!) 158/103  Pulse:   96 88  Resp:   (!) 22 (!) 21  Temp: 98.3 F (36.8 C)  98.7 F (37.1 C) 98.7 F (37.1 C)  TempSrc: Oral  Oral Oral  SpO2:  97% 96% 94%    Intake/Output Summary (Last 24 hours) at 12/28/2019 1819 Last data filed at 12/28/2019 0400 Gross per 24 hour  Intake 746.56 ml  Output --  Net 746.56 ml   There were no vitals filed for this visit.  Data Reviewed: I have personally reviewed and interpreted daily labs, tele strips, imagings as discussed above. I reviewed all nursing notes, pharmacy notes, vitals, pertinent old records I have discussed plan of care as described above with RN and patient/family.  CBC: Recent Labs  Lab 12/27/19 0846  WBC 11.4*  NEUTROABS 9.0*  HGB 14.9  HCT 43.6  MCV 94.0  PLT 195   Basic Metabolic Panel:  Recent Labs  Lab 12/27/19 0846  NA 133*  K 3.8  CL 101  CO2 22  GLUCOSE 108*  BUN 12  CREATININE 0.99  CALCIUM 8.7*    Studies: ECHOCARDIOGRAM COMPLETE  Result Date: 12/28/2019    ECHOCARDIOGRAM REPORT   Patient Name:   Alex Lee Date of Exam: 12/28/2019 Medical Rec #:  [id number]    Height: Accession #:    [id number]   Weight: Date of Birth:  Feb 05, 1976    BSA: Patient Age:    44 years     BP:           158/113 mmHg Patient Gender: M            HR:           98 bpm. Exam Location:  Inpatient Procedure: 2D  Echo and Intracardiac Opacification Agent Indications:    Abnormal ECG 794.31 / R94.31  History:        Patient has no prior history of Echocardiogram examinations.                 Signs/Symptoms:Shortness of Breath; Risk Factors:Current Smoker.                 Childhood asthma.  Sonographer:    Darlina Sicilian RDCS Referring Phys: [id number] Clint  1. Severely reduced LVEF 20-25% with diffuse hypokinesis, mildly dilated left ventricle and left atrium, grade 2 diastolic dysfunction with elevated filling pressures. RVEF appears normal.  2. Left ventricular ejection fraction, by estimation, is 20 to 25%. The left ventricle has severely decreased function. The left ventricle demonstrates global hypokinesis. The left ventricular internal cavity size was mildly dilated. Left ventricular diastolic parameters are consistent with Grade II diastolic dysfunction (pseudonormalization). Elevated left ventricular end-diastolic pressure.  3. Right ventricular systolic function is normal. The right ventricular size is normal. There is normal pulmonary artery systolic pressure.  4. Left atrial size was mildly dilated.  5. The mitral valve is normal in structure. No evidence of mitral valve regurgitation. No evidence of mitral stenosis.  6. The aortic valve is normal in structure. Aortic valve regurgitation is mild. Mild to moderate aortic valve sclerosis/calcification is present, without any evidence of aortic stenosis.  7. The inferior vena cava is normal in size with greater than 50% respiratory variability, suggesting right atrial pressure of 3 mmHg. FINDINGS  Left Ventricle: Left ventricular ejection fraction, by estimation, is 20 to 25%. The left ventricle has severely decreased function. The left ventricle demonstrates global hypokinesis. Definity contrast agent was given IV to delineate the left ventricular endocardial borders. The left ventricular internal cavity size was mildly dilated. There is no left  ventricular hypertrophy. Left ventricular diastolic parameters are consistent with Grade II diastolic dysfunction (pseudonormalization). Elevated left ventricular end-diastolic pressure. Right Ventricle: The right ventricular size is normal. No increase in right ventricular wall thickness. Right ventricular systolic function is normal. There is normal pulmonary artery systolic pressure. Left Atrium: Left atrial size was mildly dilated. Right Atrium: Right atrial size was normal in size. Pericardium: There is no evidence of pericardial effusion. Mitral Valve: The mitral valve is normal in structure. Normal mobility of the mitral valve leaflets. No evidence of mitral valve regurgitation. No evidence of mitral valve stenosis. Tricuspid Valve: The tricuspid valve is normal in structure. Tricuspid valve regurgitation is mild . No evidence of tricuspid stenosis. Aortic Valve: The aortic valve is normal in structure. Aortic valve regurgitation is mild. Mild to moderate aortic valve sclerosis/calcification is  present, without any evidence of aortic stenosis. Pulmonic Valve: The pulmonic valve was normal in structure. Pulmonic valve regurgitation is not visualized. No evidence of pulmonic stenosis. Aorta: The aortic root is normal in size and structure. Venous: The inferior vena cava is normal in size with greater than 50% respiratory variability, suggesting right atrial pressure of 3 mmHg. IAS/Shunts: No atrial level shunt detected by color flow Doppler.  LEFT VENTRICLE PLAX 2D LVIDd:         5.50 cm      Diastology LVIDs:         4.70 cm      LV e' lateral:   6.96 cm/s LV PW:         0.90 cm      LV E/e' lateral: 15.8 LV IVS:        0.80 cm      LV e' medial:    6.09 cm/s LVOT diam:     2.10 cm      LV E/e' medial:  18.1 LV SV:         56 LVOT Area:     3.46 cm                              3D Volume EF LV Volumes (MOD)            LV 3D EF:    -8.50 % LV vol d, MOD A2C: 232.0 ml LV 3D EDV:   215900.00 mm LV vol d, MOD  A4C: 201.0 ml LV 3D ESV:   234300.00 mm LV vol s, MOD A2C: 164.0 ml LV 3D SV:    -18400.00 mm LV vol s, MOD A4C: 133.0 ml LV SV MOD A2C:     68.0 ml LV SV MOD A4C:     201.0 ml LV SV MOD BP:      66.7 ml RIGHT VENTRICLE RV S prime:     10.60 cm/s TAPSE (M-mode): 2.1 cm LEFT ATRIUM             RIGHT ATRIUM LA diam:        4.00 cm RA Area:     12.90 cm LA Vol (A2C):   83.7 ml RA Volume:   31.00 ml LA Vol (A4C):   61.0 ml LA Biplane Vol: 75.9 ml  AORTIC VALVE LVOT Vmax:   101.00 cm/s LVOT Vmean:  73.800 cm/s LVOT VTI:    0.163 m  AORTA Ao Root diam: 2.90 cm MITRAL VALVE MV Area (PHT): 7.99 cm     SHUNTS MV Decel Time: 95 msec      Systemic VTI:  0.16 m MV E velocity: 110.00 cm/s  Systemic Diam: 2.10 cm MV A velocity: 60.90 cm/s MV E/A ratio:  1.81 Tobias Alexander MD Electronically signed by Tobias Alexander MD Signature Date/Time: 12/28/2019/12:59:41 PM    Final     Scheduled Meds: . azithromycin  500 mg Oral Daily  . Chlorhexidine Gluconate Cloth  6 each Topical Daily  . enoxaparin (LOVENOX) injection  40 mg Subcutaneous Q24H  . hydrALAZINE  25 mg Oral Q6H  . ipratropium-albuterol  3 mL Nebulization BID  . methylPREDNISolone (SOLU-MEDROL) injection  40 mg Intravenous Q12H   Continuous Infusions: . cefTRIAXone (ROCEPHIN)  IV 1 g (12/28/19 1146)   PRN Meds: albuterol  Time spent: 35 minutes  Author: Lynden Oxford, MD [facility] 12/28/2019 6:19 PM  To reach On-call, see care teams to locate  the attending and reach out via [email or web address]. Between 7PM-7AM, please contact night-coverage If you still have difficulty reaching the attending provider, please page the [facility] (Director on Call) for [facility] on amion for assistance.

## 2019-12-28 NOTE — Progress Notes (Signed)
  Echocardiogram 2D Echocardiogram with defintiy has been performed.  [name] M 12/28/2019, 9:07 AM

## 2019-12-29 ENCOUNTER — Inpatient Hospital Stay (HOSPITAL_COMMUNITY): Payer: 59

## 2019-12-29 ENCOUNTER — Encounter (HOSPITAL_COMMUNITY): Payer: Self-pay | Admitting: Internal Medicine

## 2019-12-29 DIAGNOSIS — I5042 Chronic combined systolic (congestive) and diastolic (congestive) heart failure: Secondary | ICD-10-CM

## 2019-12-29 DIAGNOSIS — I5041 Acute combined systolic (congestive) and diastolic (congestive) heart failure: Secondary | ICD-10-CM

## 2019-12-29 DIAGNOSIS — I428 Other cardiomyopathies: Secondary | ICD-10-CM

## 2019-12-29 LAB — BASIC METABOLIC PANEL
Anion gap: 7 (ref 5–15)
BUN: 21 mg/dL — ABNORMAL HIGH (ref 6–20)
CO2: 22 mmol/L (ref 22–32)
Calcium: 8.7 mg/dL — ABNORMAL LOW (ref 8.9–10.3)
Chloride: 104 mmol/L (ref 98–111)
Creatinine, Ser: 0.82 mg/dL (ref 0.61–1.24)
GFR calc Af Amer: >60 mL/min (ref >60)
GFR calc non Af Amer: >60 mL/min (ref >60)
Glucose, Bld: 154 mg/dL — ABNORMAL HIGH (ref 70–99)
Potassium: 4 mmol/L (ref 3.5–5.1)
Sodium: 133 mmol/L — ABNORMAL LOW (ref 135–145)

## 2019-12-29 LAB — CBC
HCT: 40.5 % (ref 39.0–52.0)
Hemoglobin: 13.7 g/dL (ref 13.0–17.0)
MCH: 32.3 pg (ref 26.0–34.0)
MCHC: 33.8 g/dL (ref 30.0–36.0)
MCV: 95.5 fL (ref 80.0–100.0)
Platelets: 242 10*3/uL (ref 150–400)
RBC: 4.24 MIL/uL (ref 4.22–5.81)
RDW: 11.7 % (ref 11.5–15.5)
WBC: 17.8 10*3/uL — ABNORMAL HIGH (ref 4.0–10.5)
nRBC: 0 % (ref 0.0–0.2)

## 2019-12-29 LAB — LIPID PANEL
Cholesterol: 191 mg/dL (ref 0–200)
HDL: 33 mg/dL — ABNORMAL LOW (ref >40)
LDL Cholesterol: 141 mg/dL — ABNORMAL HIGH (ref 0–99)
Total CHOL/HDL Ratio: 5.8 RATIO
Triglycerides: 84 mg/dL (ref <150)
VLDL: 17 mg/dL (ref 0–40)

## 2019-12-29 LAB — SEDIMENTATION RATE: Sed Rate: 15 mm/hr (ref 0–16)

## 2019-12-29 LAB — TROPONIN I (HIGH SENSITIVITY): Troponin I (High Sensitivity): 11 ng/L (ref <18)

## 2019-12-29 LAB — PROTIME-INR
INR: 1 (ref 0.8–1.2)
Prothrombin Time: 12.7 seconds (ref 11.4–15.2)

## 2019-12-29 LAB — MAGNESIUM: Magnesium: 2.2 mg/dL (ref 1.7–2.4)

## 2019-12-29 LAB — T4, FREE: Free T4: 0.97 ng/dL (ref 0.61–1.12)

## 2019-12-29 LAB — TSH: TSH: 1.147 u[IU]/mL (ref 0.350–4.500)

## 2019-12-29 MED ORDER — PREDNISONE 50 MG PO TABS
50.0000 mg | ORAL_TABLET | Freq: Every day | ORAL | Status: DC
  Administered 2019-12-30: 50 mg via ORAL
  Filled 2019-12-29: qty 1

## 2019-12-29 MED ORDER — NITROGLYCERIN 0.4 MG SL SUBL
0.8000 mg | SUBLINGUAL_TABLET | Freq: Once | SUBLINGUAL | Status: AC
  Administered 2019-12-29: 0.8 mg via SUBLINGUAL

## 2019-12-29 MED ORDER — METOPROLOL TARTRATE 25 MG PO TABS
25.0000 mg | ORAL_TABLET | Freq: Once | ORAL | Status: AC
  Administered 2019-12-29: 25 mg via ORAL
  Filled 2019-12-29: qty 1

## 2019-12-29 MED ORDER — IOHEXOL 350 MG/ML SOLN
80.0000 mL | Freq: Once | INTRAVENOUS | Status: AC | PRN
  Administered 2019-12-29: 80 mL via INTRAVENOUS

## 2019-12-29 MED ORDER — CARVEDILOL 3.125 MG PO TABS
3.1250 mg | ORAL_TABLET | Freq: Two times a day (BID) | ORAL | Status: DC
  Administered 2019-12-29 – 2019-12-30 (×3): 3.125 mg via ORAL
  Filled 2019-12-29 (×3): qty 1

## 2019-12-29 MED ORDER — METOPROLOL TARTRATE 5 MG/5ML IV SOLN
10.0000 mg | Freq: Once | INTRAVENOUS | Status: AC
  Administered 2019-12-29: 10 mg via INTRAVENOUS

## 2019-12-29 MED ORDER — ACETAMINOPHEN 325 MG PO TABS
650.0000 mg | ORAL_TABLET | Freq: Four times a day (QID) | ORAL | Status: DC | PRN
  Administered 2019-12-29 (×3): 650 mg via ORAL
  Filled 2019-12-29 (×3): qty 2

## 2019-12-29 MED ORDER — LOSARTAN POTASSIUM 25 MG PO TABS
25.0000 mg | ORAL_TABLET | Freq: Every day | ORAL | Status: DC
  Administered 2019-12-30: 25 mg via ORAL
  Filled 2019-12-29: qty 1

## 2019-12-29 MED ORDER — METOPROLOL TARTRATE 5 MG/5ML IV SOLN
5.0000 mg | Freq: Once | INTRAVENOUS | Status: AC
  Administered 2019-12-29: 5 mg via INTRAVENOUS

## 2019-12-29 MED ORDER — LISINOPRIL 10 MG PO TABS
10.0000 mg | ORAL_TABLET | Freq: Every day | ORAL | Status: DC
  Administered 2019-12-29: 10 mg via ORAL
  Filled 2019-12-29: qty 1

## 2019-12-29 MED ORDER — LABETALOL HCL 5 MG/ML IV SOLN: 10.0000 mg | INTRAVENOUS | Status: DC | PRN

## 2019-12-29 NOTE — TOC Initial Note (Signed)
Transition of Care [facility]) - Initial/Assessment Note    Patient Details  Name: Alex Lee MRN: [id number] Date of Birth: [birth year]  Transition of Care (TOC) CM/SW Contact:    Armanda Heritage, RN Phone Number: 12/29/2019, 2:21 PM  Clinical Narrative:    CM spoke with patient regarding lack of pcp.  Patient reports he is interested in establishing pcp care but will be moving to the [place] [facility] area in a week.  CM explained that patient can contact his insurance carrier for a list of in-network providers in the area where he will move and select a provider from that list.  Should patient remain in this area longer than expected, CM placed [facility] information on AVS if he wishes to establish care there.                 Expected Discharge Plan: Home/Self Care Barriers to Discharge: Continued Medical Work up   Patient Goals and CMS Choice Patient states their goals for this hospitalization and ongoing recovery are:: to go home      Expected Discharge Plan and Services Expected Discharge Plan: Home/Self Care   Discharge Planning Services: CM Consult                     DME Arranged: N/A DME Agency: NA       HH Arranged: NA HH Agency: NA        Prior Living Arrangements/Services     Patient language and need for interpreter reviewed:: Yes Do you feel safe going back to the place where you live?: Yes      Need for Family Participation in Patient Care: No (Comment) Care giver support system in place?: No (comment)   Criminal Activity/Legal Involvement Pertinent to Current Situation/Hospitalization: No - Comment as needed  Activities of Daily Living Home Assistive Devices/Equipment: None ADL Screening (condition at time of admission) Patient's cognitive ability adequate to safely complete daily activities?: Yes Is the patient deaf or have difficulty hearing?: No Does the patient have difficulty seeing, even when wearing glasses/contacts?: No Does the patient have  difficulty concentrating, remembering, or making decisions?: No Patient able to express need for assistance with ADLs?: Yes Does the patient have difficulty dressing or bathing?: No Independently performs ADLs?: Yes (appropriate for developmental age) Does the patient have difficulty walking or climbing stairs?: No Weakness of Legs: None Weakness of Arms/Hands: None  Permission Sought/Granted                  Emotional Assessment   Attitude/Demeanor/Rapport: Engaged Affect (typically observed): Accepting Orientation: : Oriented to Self, Oriented to Place, Oriented to  Time, Oriented to Situation   Psych Involvement: No (comment)  Admission diagnosis:  Community acquired pneumonia [J18.9] Community acquired pneumonia of left upper lobe of lung [J18.9] Patient Active Problem List   Diagnosis Date Noted  . Acute combined systolic and diastolic heart failure (HCC)   . Childhood asthma 12/27/2019  . Community acquired pneumonia 12/27/2019   PCP:  Patient, No Pcp Per Pharmacy:   [id number] - [place], [place] - [address] AT [facility] [address] [place] [id number] Phone: [phone] Fax: [phone]     Social Determinants of Health (SDOH) Interventions    Readmission Risk Interventions No flowsheet data found.

## 2019-12-29 NOTE — Consult Note (Signed)
Cardiology Consultation:   Patient ID: Alex Lee MRN: [id number]; DOB: 1975-09-09  Admit date: 12/27/2019 Date of Consult: 12/29/2019  Primary Care Provider: Patient, No Pcp Per [facility] Cardiologist: No primary care provider on file. New [facility] Electrophysiologist:  None    Patient Profile:   Alex Lee is a 44 y.o. male with a hx of asathma who is being seen today for the evaluation of new cardiomyopathy at the request of Dr. Allena Katz.  History of Present Illness:   Mr. Boeding with no prior cardiac hx but childhood asthma, + tobacco presented 12/27/19 with SOB and cough.  2 days prior he had SOB and prior to admit it felt like his asthma.  His SOB severe enough to call EMS.  Breathing treatment helped for short period of time, he later presented to ER.  + fever on admit at 101.4 first he had cough and then chest pain with breathing.    BP on arrival 158/113  Now improved.  He could feel his heart racing and it has in recent past.     EKG:  The EKG was personally reviewed and demonstrates:  12/27/19 ST at 112 with prob LA enlargement prob LVH and?ST elevation vs early repol.  No old ones to compare. Follow up with HR 98 and non specific T wave abnormality   Telemetry:  Telemetry was personally reviewed and demonstrates:  SR at 98 Hs Troponin 11 on the 21st Na 133, K+ 4.0 BUN 21 Cr 0.82 Mg+ 2.2 LFTs WNL BNP 175 LDL 141, HDL 33  CRP 9.2 Ferritin 327  WBC was 11.4 now 17.8 on solumedrol   Hgb 13.7 plts 242  Fibrinogen 477, ddimer 0.37 Flu A&B neg Covid neg ( no hx of having COVID) UDS neg Blood cultures pending.   2V CXR IMPRESSION: Concern for LEFT upper lobe pneumonia versus asymmetric pulmonary Edema.  Echo with EF 20-25%, diffuse hypokinesis, mildly dilated LV and LA G2DD RV size and function are normal.  Mild to mod aortic sclerosis but no stenosis. No MR   Currently BP 131/84 { 66 R 18-24 afebrile.  On ASA coreg 3.125 BID lisinopril 10  New  this admit and on solumedrol (was on apresoline and imdur but both stopped) No pain now, breathing better  Works with asphalt in heat.   Wife was with him in room.    Past Medical History:  Diagnosis Date  . Childhood asthma   . Tobacco abuse     History reviewed. No pertinent surgical history.   Home Medications:  Prior to Admission medications   Medication Sig Start Date End Date Taking? Authorizing Provider  cephALEXin (KEFLEX) 250 MG capsule Take 250 mg by mouth in the morning and at bedtime. 12/10/19  Yes [provider]    Inpatient Medications: Scheduled Meds: . aspirin EC  81 mg Oral Daily  . azithromycin  500 mg Oral Daily  . carvedilol  3.125 mg Oral BID WC  . Chlorhexidine Gluconate Cloth  6 each Topical Daily  . dextromethorphan-guaiFENesin  1 tablet Oral BID  . enoxaparin (LOVENOX) injection  40 mg Subcutaneous Q24H  . ipratropium-albuterol  3 mL Nebulization BID  . lisinopril  10 mg Oral Daily  . methylPREDNISolone (SOLU-MEDROL) injection  40 mg Intravenous Q12H   Continuous Infusions: . cefTRIAXone (ROCEPHIN)  IV 1 g (12/29/19 0937)   PRN Meds: acetaminophen, albuterol, labetalol  Allergies:   No Known Allergies  Social History:   Social History   Socioeconomic  History  . Marital status: Married    Spouse name: Not on file  . Number of children: Not on file  . Years of education: Not on file  . Highest education level: Not on file  Occupational History  . Not on file  Tobacco Use  . Smoking status: Current Every Day Smoker    Packs/day: 0.50  . Smokeless tobacco: Never Used  Substance and Sexual Activity  . Alcohol use: Yes    Alcohol/week: 10.0 standard drinks    Types: 10 Cans of beer per week  . Drug use: Not Currently  . Sexual activity: Not on file  Other Topics Concern  . Not on file  Social History Narrative  . Not on file   Social Determinants of Health   Financial Resource Strain:   . Difficulty of Paying Living  Expenses:   Food Insecurity:   . Worried About Programme researcher, broadcasting/film/video in the Last Year:   . Barista in the Last Year:   Transportation Needs:   . Freight forwarder (Medical):   Marland Kitchen Lack of Transportation (Non-Medical):   Physical Activity:   . Days of Exercise per Week:   . Minutes of Exercise per Session:   Stress:   . Feeling of Stress :   Social Connections:   . Frequency of Communication with Friends and Family:   . Frequency of Social Gatherings with Friends and Family:   . Attends Religious Services:   . Active Member of Clubs or Organizations:   . Attends Banker Meetings:   Marland Kitchen Marital Status:   Intimate Partner Violence:   . Fear of Current or Ex-Partner:   . Emotionally Abused:   Marland Kitchen Physically Abused:   . Sexually Abused:     Family History:   Family History  Problem Relation Age of Onset  . Hypertension Mother   . Hypertension Father   NO CAD   ROS:  Please see the history of present illness.  General:no colds or fevers, no weight changes Skin:no rashes or ulcers, + cyst on back of head with rupture last pm, and sty on both eyelids, saw opthalmologist last week and given ABX HEENT:no blurred vision, no congestion CV:see HPI PUL:see HPI GI:no diarrhea constipation or melena, no indigestion GU:no hematuria, no dysuria MS:no joint pain, no claudication Neuro:no syncope, no lightheadedness Endo:no diabetes, no thyroid disease  All other ROS reviewed and negative.     Physical Exam/Data:   Vitals:   12/29/19 0110 12/29/19 0517 12/29/19 0821 12/29/19 0958  BP: 130/84 131/84    Pulse: 86 65 64 73  Resp: 18 18 18    Temp: 97.9 F (36.6 C) 98.4 F (36.9 C)    TempSrc: Oral Oral    SpO2: 93% 95% 95%     Intake/Output Summary (Last 24 hours) at 12/29/2019 1001 Last data filed at 12/29/2019 0600 Gross per 24 hour  Intake 340 ml  Output 600 ml  Net -260 ml   No flowsheet data found.   There is no height or weight on file to calculate  BMI.  General:  Well nourished, well developed, in no acute distress HEENT: normal Lymph: no adenopathy Neck: no JVD at 10 degrees Endocrine:  No thryomegaly Vascular: No carotid bruits; pedal pulses 2+ bilaterally  Cardiac:  normal S1, S2; RRR; no murmur gallup rub or click  Lungs:  Diminished and with exp wheezes to auscultation bilaterally, no  rhonchi or rales  Abd: soft, nontender, no  hepatomegaly  Ext: no edema Musculoskeletal:  No deformities, BUE and BLE strength normal and equal Skin: warm and dry  Neuro:  Alert and oriented X 3 MAE follows commands, no focal abnormalities noted Psych:  Normal affect    Relevant CV Studies: Echo 12/28/19  1. Severely reduced LVEF 20-25% with diffuse hypokinesis, mildly dilated  left ventricle and left atrium, grade 2 diastolic dysfunction with  elevated filling pressures. RVEF appears normal.  2. Left ventricular ejection fraction, by estimation, is 20 to 25%. The  left ventricle has severely decreased function. The left ventricle  demonstrates global hypokinesis. The left ventricular internal cavity size  was mildly dilated. Left ventricular  diastolic parameters are consistent with Grade II diastolic dysfunction  (pseudonormalization). Elevated left ventricular end-diastolic pressure.  3. Right ventricular systolic function is normal. The right ventricular  size is normal. There is normal pulmonary artery systolic pressure.  4. Left atrial size was mildly dilated.  5. The mitral valve is normal in structure. No evidence of mitral valve  regurgitation. No evidence of mitral stenosis.  6. The aortic valve is normal in structure. Aortic valve regurgitation is  mild. Mild to moderate aortic valve sclerosis/calcification is present,  without any evidence of aortic stenosis.  7. The inferior vena cava is normal in size with greater than 50%  respiratory variability, suggesting right atrial pressure of 3 mmHg.   FINDINGS  Left  Ventricle: Left ventricular ejection fraction, by estimation, is 20  to 25%. The left ventricle has severely decreased function. The left  ventricle demonstrates global hypokinesis. Definity contrast agent was  given IV to delineate the left  ventricular endocardial borders. The left ventricular internal cavity size  was mildly dilated. There is no left ventricular hypertrophy. Left  ventricular diastolic parameters are consistent with Grade II diastolic  dysfunction (pseudonormalization).  Elevated left ventricular end-diastolic pressure.   Right Ventricle: The right ventricular size is normal. No increase in  right ventricular wall thickness. Right ventricular systolic function is  normal. There is normal pulmonary artery systolic pressure.   Left Atrium: Left atrial size was mildly dilated.   Right Atrium: Right atrial size was normal in size.   Pericardium: There is no evidence of pericardial effusion.   Mitral Valve: The mitral valve is normal in structure. Normal mobility of  the mitral valve leaflets. No evidence of mitral valve regurgitation. No  evidence of mitral valve stenosis.   Tricuspid Valve: The tricuspid valve is normal in structure. Tricuspid  valve regurgitation is mild . No evidence of tricuspid stenosis.   Aortic Valve: The aortic valve is normal in structure. Aortic valve  regurgitation is mild. Mild to moderate aortic valve  sclerosis/calcification is present, without any evidence of aortic  stenosis.   Pulmonic Valve: The pulmonic valve was normal in structure. Pulmonic valve  regurgitation is not visualized. No evidence of pulmonic stenosis.   Aorta: The aortic root is normal in size and structure.   Venous: The inferior vena cava is normal in size with greater than 50%  respiratory variability, suggesting right atrial pressure of 3 mmHg.   IAS/Shunts: No atrial level shunt detected by color flow Doppler.   Laboratory Data:  High Sensitivity  Troponin:   Recent Labs  Lab 12/29/19 0439  TROPONINIHS 11     Chemistry Recent Labs  Lab 12/27/19 0846 12/29/19 0417  NA 133* 133*  K 3.8 4.0  CL 101 104  CO2 22 22  GLUCOSE 108* 154*  BUN 12 21*  CREATININE 0.99 0.82  CALCIUM 8.7* 8.7*  GFRNONAA >60 >60  GFRAA >60 >60  ANIONGAP 10 7    Recent Labs  Lab 12/27/19 0846  PROT 7.6  ALBUMIN 4.1  AST 22  ALT 29  ALKPHOS 50  BILITOT 1.2   Hematology Recent Labs  Lab 12/27/19 0846 12/29/19 0417  WBC 11.4* 17.8*  RBC 4.64 4.24  HGB 14.9 13.7  HCT 43.6 40.5  MCV 94.0 95.5  MCH 32.1 32.3  MCHC 34.2 33.8  RDW 11.7 11.7  PLT 195 242   BNP Recent Labs  Lab 12/27/19 1330  BNP 175.3*    DDimer  Recent Labs  Lab 12/27/19 0846  DDIMER 0.37     Radiology/Studies:  DG Chest 2 View  Result Date: 12/27/2019 CLINICAL DATA:  Short of breath.  Cough EXAM: CHEST - 2 VIEW COMPARISON:  None. FINDINGS: Normal cardiac silhouette. There is diffuse airspace density in the LEFT upper lobe. RIGHT lung clear. Lung bases clear. IMPRESSION: Concern for LEFT upper lobe pneumonia versus asymmetric pulmonary edema. Electronically Signed   By: Genevive Bi M.D.   On: 12/27/2019 08:11   ECHOCARDIOGRAM COMPLETE  Result Date: 12/28/2019    ECHOCARDIOGRAM REPORT   Patient Name:   HARTWELL VANDIVER Date of Exam: 12/28/2019 Medical Rec #:  [id number]    Height: Accession #:    [id number]   Weight: Date of Birth:  1976-05-03    BSA: Patient Age:    44 years     BP:           158/113 mmHg Patient Gender: M            HR:           98 bpm. Exam Location:  Inpatient Procedure: 2D Echo and Intracardiac Opacification Agent Indications:    Abnormal ECG 794.31 / R94.31  History:        Patient has no prior history of Echocardiogram examinations.                 Signs/Symptoms:Shortness of Breath; Risk Factors:Current Smoker.                 Childhood asthma.  Sonographer:    [name] RDCS Referring Phys: [id number] Charlane Ferretti IMPRESSIONS  1.  Severely reduced LVEF 20-25% with diffuse hypokinesis, mildly dilated left ventricle and left atrium, grade 2 diastolic dysfunction with elevated filling pressures. RVEF appears normal.  2. Left ventricular ejection fraction, by estimation, is 20 to 25%. The left ventricle has severely decreased function. The left ventricle demonstrates global hypokinesis. The left ventricular internal cavity size was mildly dilated. Left ventricular diastolic parameters are consistent with Grade II diastolic dysfunction (pseudonormalization). Elevated left ventricular end-diastolic pressure.  3. Right ventricular systolic function is normal. The right ventricular size is normal. There is normal pulmonary artery systolic pressure.  4. Left atrial size was mildly dilated.  5. The mitral valve is normal in structure. No evidence of mitral valve regurgitation. No evidence of mitral stenosis.  6. The aortic valve is normal in structure. Aortic valve regurgitation is mild. Mild to moderate aortic valve sclerosis/calcification is present, without any evidence of aortic stenosis.  7. The inferior vena cava is normal in size with greater than 50% respiratory variability, suggesting right atrial pressure of 3 mmHg. FINDINGS  Left Ventricle: Left ventricular ejection fraction, by estimation, is 20 to 25%. The left ventricle has severely decreased function. The left ventricle demonstrates global hypokinesis. Definity contrast agent was given IV  to delineate the left ventricular endocardial borders. The left ventricular internal cavity size was mildly dilated. There is no left ventricular hypertrophy. Left ventricular diastolic parameters are consistent with Grade II diastolic dysfunction (pseudonormalization). Elevated left ventricular end-diastolic pressure. Right Ventricle: The right ventricular size is normal. No increase in right ventricular wall thickness. Right ventricular systolic function is normal. There is normal pulmonary artery  systolic pressure. Left Atrium: Left atrial size was mildly dilated. Right Atrium: Right atrial size was normal in size. Pericardium: There is no evidence of pericardial effusion. Mitral Valve: The mitral valve is normal in structure. Normal mobility of the mitral valve leaflets. No evidence of mitral valve regurgitation. No evidence of mitral valve stenosis. Tricuspid Valve: The tricuspid valve is normal in structure. Tricuspid valve regurgitation is mild . No evidence of tricuspid stenosis. Aortic Valve: The aortic valve is normal in structure. Aortic valve regurgitation is mild. Mild to moderate aortic valve sclerosis/calcification is present, without any evidence of aortic stenosis. Pulmonic Valve: The pulmonic valve was normal in structure. Pulmonic valve regurgitation is not visualized. No evidence of pulmonic stenosis. Aorta: The aortic root is normal in size and structure. Venous: The inferior vena cava is normal in size with greater than 50% respiratory variability, suggesting right atrial pressure of 3 mmHg. IAS/Shunts: No atrial level shunt detected by color flow Doppler.  LEFT VENTRICLE PLAX 2D LVIDd:         5.50 cm      Diastology LVIDs:         4.70 cm      LV e' lateral:   6.96 cm/s LV PW:         0.90 cm      LV E/e' lateral: 15.8 LV IVS:        0.80 cm      LV e' medial:    6.09 cm/s LVOT diam:     2.10 cm      LV E/e' medial:  18.1 LV SV:         56 LVOT Area:     3.46 cm                              3D Volume EF LV Volumes (MOD)            LV 3D EF:    -8.50 % LV vol d, MOD A2C: 232.0 ml LV 3D EDV:   215900.00 mm LV vol d, MOD A4C: 201.0 ml LV 3D ESV:   234300.00 mm LV vol s, MOD A2C: 164.0 ml LV 3D SV:    -18400.00 mm LV vol s, MOD A4C: 133.0 ml LV SV MOD A2C:     68.0 ml LV SV MOD A4C:     201.0 ml LV SV MOD BP:      66.7 ml RIGHT VENTRICLE RV S prime:     10.60 cm/s TAPSE (M-mode): 2.1 cm LEFT ATRIUM             RIGHT ATRIUM LA diam:        4.00 cm RA Area:     12.90 cm LA Vol (A2C):    83.7 ml RA Volume:   31.00 ml LA Vol (A4C):   61.0 ml LA Biplane Vol: 75.9 ml  AORTIC VALVE LVOT Vmax:   101.00 cm/s LVOT Vmean:  73.800 cm/s LVOT VTI:    0.163 m  AORTA Ao Root diam: 2.90 cm MITRAL VALVE MV  Area (PHT): 7.99 cm     SHUNTS MV Decel Time: 95 msec      Systemic VTI:  0.16 m MV E velocity: 110.00 cm/s  Systemic Diam: 2.10 cm MV A velocity: 60.90 cm/s MV E/A ratio:  1.81 Ena Dawley MD Electronically signed by Ena Dawley MD Signature Date/Time: 12/28/2019/12:59:41 PM    Final         3   Assessment and Plan:   1. Admit with SOB and mild chest pain, possible PNA on CXR  On ABX and steroids.   Did have fever on arrival.  2. Cardiomyopathy new systolic and diastolic.  Now on BB and ACE, may be better to change to ARB for possible entresto, will need spironolactone. May be prudent to do Rt and Lt heart cath today or tomorrow to better eval.  Dr Oval Linsey to see.  Need BP control, stop tobacco and ETOH briefly discussed these issues.   3. HTN on arrival but no out pt meds - does not follow with any MD 4. 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 Was on keflex by opthalmologist   5. Tobacco use half PPD discussed need to stop 6. ETOH use with beer 2-3 cans twice a week day and 2-3 both weekend days.  Discussed need to stop       For questions or updates, please contact [place] Please consult [email or web address] for contact info under    Signed, Cecilie Kicks, NP  12/29/2019 10:01 AM

## 2019-12-29 NOTE — Progress Notes (Signed)
Patient has had elevated BP throughout stay, BP currently 159/110, administered scheduled hydralazine 25mg . Patient currently has compliant of headache, message sent to MD on call to notify MD of patient's current BP and complaint. PRN order placed for tylenol and Labetalol (see MAR). Will recheck patient's BP and administer PRNs if needed. Patient is stable and being monitored. , RN

## 2019-12-29 NOTE — Progress Notes (Signed)
Patient to have Cardiac CT scan this afternoon at 4 p.m.  [facility] notified RN pickup will be at 3:30 p.m. today.

## 2019-12-29 NOTE — Progress Notes (Signed)
[facility] Progress Note  Patient: Alex Lee    WUJ:[id number]  DOA: 12/27/2019     Date of Service: the patient was seen and examined on 12/29/2019  Chief Complaint  Patient presents with   Cough   Chest Pain   Brief hospital course: Childhood asthma, active smoker.  Presents with complaints of shortness of breath and found to have acute asthma exacerbation Currently plan is continue current management.  Assessment and Plan: 1.  Acute asthma exacerbation ?  Sepsis secondary to community-acquired pneumonia POA. X-ray chest shows left upper lobe pneumonia fever, tachycardia and tachypnea on admission. Influenza PCR negative, SARS Covid negative. Continue with IV antibiotics. Follow-up on cultures.  So far no growth Continue nebulizer therapy. Back to oral steroids given significant improvement in wheezing. Add Mucinex  2.  Elevated BNP. Cardiomyopathy Echocardiogram performed on admission Shows 20 to 35% EF with diffuse hypokinesis Cardiology consulted. Underwent coronary CT which showed coronary calcium score of 0 and no evidence of CAD. Management per cardiology. Likely nonischemic cardiomyopathy  3.  Active smoker. Alcohol use history. Cardiology consult the patient against smoking and recommended to cut down on alcohol intake.  Diet: Cardiac diet DVT Prophylaxis: Subcutaneous Lovenox   Advance goals of care discussion: Full code  Family Communication: no family was present at bedside, at the time of interview.   Disposition:  Status is: Inpatient  Remains inpatient appropriate because:Ongoing diagnostic testing needed not appropriate for outpatient work up and IV treatments appropriate due to intensity of illness or inability to take PO   Dispo: The patient is from: Home              Anticipated d/c is to: Home              Anticipated d/c date is: 2 days              Patient currently is not medically stable to d/c.  Subjective: Breathing  improved.  No nausea no vomiting.  No fever no chills.  Physical Exam:  General: Appear in mild distress, no Rash; Oral Mucosa Clear, moist. no Abnormal Neck Mass Or lumps, Conjunctiva normal  Cardiovascular: S1 and S2 Present, no Murmur, Respiratory: increased respiratory effort, Bilateral Air entry present and occasional faint crackles, no wheezes Abdomen: Bowel Sound present, Soft and no tenderness Extremities: no Pedal edema, no calf tenderness Neurology: alert and oriented to time, place, and person affect flat. no new focal deficit Gait not checked due to patient safety concerns  Vitals:   12/29/19 0958 12/29/19 1335 12/29/19 1421 12/29/19 1738  BP:  126/86  127/81  Pulse: 73 78 81 79  Resp:  20  18  Temp:  98.6 F (37 C)    TempSrc:  Oral    SpO2:  95%  98%    Intake/Output Summary (Last 24 hours) at 12/29/2019 1853 Last data filed at 12/29/2019 1727 Gross per 24 hour  Intake 580 ml  Output 600 ml  Net -20 ml   There were no vitals filed for this visit.  Data Reviewed: I have personally reviewed and interpreted daily labs, tele strips, imagings as discussed above. I reviewed all nursing notes, pharmacy notes, vitals, pertinent old records I have discussed plan of care as described above with RN and patient/family.  CBC: Recent Labs  Lab 12/27/19 0846 12/29/19 0417  WBC 11.4* 17.8*  NEUTROABS 9.0*  --   HGB 14.9 13.7  HCT 43.6 40.5  MCV 94.0 95.5  PLT 195 242  Basic Metabolic Panel: Recent Labs  Lab 12/27/19 0846 12/29/19 0417  NA 133* 133*  K 3.8 4.0  CL 101 104  CO2 22 22  GLUCOSE 108* 154*  BUN 12 21*  CREATININE 0.99 0.82  CALCIUM 8.7* 8.7*  MG  --  2.2    Studies: CT CORONARY MORPH W/CTA COR W/SCORE W/CA W/CM &/OR WO/CM  Addendum Date: 12/29/2019   ADDENDUM REPORT: 12/29/2019 18:30 CLINICAL DATA:  86M with new onset systolic heart failure and elevated troponin. EXAM: Cardiac/Coronary  CT TECHNIQUE: The patient was scanned on a [employer]. FINDINGS: A 120 kV prospective scan was triggered in the descending thoracic aorta at 111 HU's. Axial non-contrast 3 mm slices were carried out through the heart. The data set was analyzed on a dedicated work station and scored using the Agatson method. Gantry rotation speed was 250 msecs and collimation was .6 mm. No beta blockade and 0.8 mg of sl NTG was given. The 3D data set was reconstructed in 5% intervals of the 67-82 % of the R-R cycle. Diastolic phases were analyzed on a dedicated work station using MPR, MIP and VRT modes. The patient received 80 cc of contrast. Aorta: Normal size. Ascending aorta 3.0 cm. No calcifications. No dissection. Aortic Valve:  Trileaflet.  No calcifications. Coronary Arteries:  Normal coronary origin.  Right dominance. RCA is a large dominant artery that gives rise to PDA and PLVB. There is no plaque. Left main is a large artery that gives rise to LAD and LCX arteries. LAD is a large vessel that has no plaque. There is a small D1, large branching D2 and small D3 without plaque. LCX is a non-dominant artery that gives rise to a small OM1 and large OM2 branch. There is no plaque. Other findings: Normal pulmonary vein drainage into the left atrium. Normal let atrial appendage without a thrombus. Normal size of the pulmonary artery. IMPRESSION: 1. Coronary calcium score of 0. This was 0 percentile for age and sex matched control. 2. Normal coronary origin with right dominance. 3. No evidence of CAD. Chilton Si, MD Electronically Signed   By: Chilton Si   On: 12/29/2019 18:30   Result Date: 12/29/2019 EXAM: OVER-READ INTERPRETATION  CT CHEST The following report is an over-read performed by radiologist Dr. Leanna Battles of [facility] on 12/29/2019. This over-read does not include interpretation of cardiac or coronary anatomy or pathology. The coronary calcium score/coronary CTA interpretation by the cardiologist is attached. COMPARISON:   None. FINDINGS: Vascular: Heart is enlarged.  No pericardial effusion. Mediastinum/Nodes: None. Lungs/Pleura: Tiny subpleural lymph node along the minor fissure. Minimal dependent atelectasis bilaterally. No pleural fluid. Visualized airway is unremarkable. Upper Abdomen: None. Musculoskeletal: None. IMPRESSION: No acute extracardiac findings. Electronically Signed: By: Leanna Battles M.D. On: 12/29/2019 16:34    Scheduled Meds:  aspirin EC  81 mg Oral Daily   azithromycin  500 mg Oral Daily   carvedilol  3.125 mg Oral BID WC   Chlorhexidine Gluconate Cloth  6 each Topical Daily   dextromethorphan-guaiFENesin  1 tablet Oral BID   enoxaparin (LOVENOX) injection  40 mg Subcutaneous Q24H   ipratropium-albuterol  3 mL Nebulization BID   [START ON 12/30/2019] losartan  25 mg Oral Daily   [START ON 12/30/2019] predniSONE  50 mg Oral Q breakfast   Continuous Infusions:  cefTRIAXone (ROCEPHIN)  IV 1 g (12/29/19 0937)   PRN Meds: acetaminophen, albuterol, labetalol  Time spent: 35 minutes  Author: Lynden Oxford, MD  [facility] 12/29/2019 6:53 PM  To reach On-call, see care teams to locate the attending and reach out via [email or web address]. Between 7PM-7AM, please contact night-coverage If you still have difficulty reaching the attending provider, please page the [facility] (Director on Call) for [facility] on amion for assistance.

## 2019-12-30 DIAGNOSIS — I10 Essential (primary) hypertension: Secondary | ICD-10-CM

## 2019-12-30 LAB — HEMOGLOBIN A1C
Hgb A1c MFr Bld: 4.9 % (ref 4.8–5.6)
Mean Plasma Glucose: 94 mg/dL

## 2019-12-30 LAB — BASIC METABOLIC PANEL
Anion gap: 9 (ref 5–15)
BUN: 20 mg/dL (ref 6–20)
CO2: 21 mmol/L — ABNORMAL LOW (ref 22–32)
Calcium: 8.5 mg/dL — ABNORMAL LOW (ref 8.9–10.3)
Chloride: 108 mmol/L (ref 98–111)
Creatinine, Ser: 0.71 mg/dL (ref 0.61–1.24)
GFR calc Af Amer: >60 mL/min (ref >60)
GFR calc non Af Amer: >60 mL/min (ref >60)
Glucose, Bld: 111 mg/dL — ABNORMAL HIGH (ref 70–99)
Potassium: 3.9 mmol/L (ref 3.5–5.1)
Sodium: 138 mmol/L (ref 135–145)

## 2019-12-30 LAB — CBC
HCT: 39.9 % (ref 39.0–52.0)
Hemoglobin: 13.5 g/dL (ref 13.0–17.0)
MCH: 32.4 pg (ref 26.0–34.0)
MCHC: 33.8 g/dL (ref 30.0–36.0)
MCV: 95.7 fL (ref 80.0–100.0)
Platelets: 259 10*3/uL (ref 150–400)
RBC: 4.17 MIL/uL — ABNORMAL LOW (ref 4.22–5.81)
RDW: 11.6 % (ref 11.5–15.5)
WBC: 20.6 10*3/uL — ABNORMAL HIGH (ref 4.0–10.5)
nRBC: 0 % (ref 0.0–0.2)

## 2019-12-30 LAB — CULTURE, RESPIRATORY W GRAM STAIN: Culture: NORMAL

## 2019-12-30 MED ORDER — PREDNISONE 10 MG PO TABS: ORAL_TABLET | ORAL | 0 refills | Status: DC

## 2019-12-30 MED ORDER — DM-GUAIFENESIN ER 30-600 MG PO TB12: 1.0000 | ORAL_TABLET | Freq: Two times a day (BID) | ORAL | 0 refills | Status: AC

## 2019-12-30 MED ORDER — ASPIRIN 81 MG PO TBEC: 81.0000 mg | DELAYED_RELEASE_TABLET | Freq: Every day | ORAL | 11 refills | Status: DC

## 2019-12-30 MED ORDER — CEPHALEXIN 500 MG PO CAPS: 500.0000 mg | ORAL_CAPSULE | Freq: Three times a day (TID) | ORAL | 0 refills | Status: AC

## 2019-12-30 MED ORDER — ALBUTEROL SULFATE HFA 108 (90 BASE) MCG/ACT IN AERS: 2.0000 | INHALATION_SPRAY | Freq: Four times a day (QID) | RESPIRATORY_TRACT | 0 refills | Status: AC | PRN

## 2019-12-30 MED ORDER — CEPHALEXIN 500 MG PO CAPS
500.0000 mg | ORAL_CAPSULE | Freq: Three times a day (TID) | ORAL | Status: DC
  Administered 2019-12-30: 500 mg via ORAL
  Filled 2019-12-30: qty 1

## 2019-12-30 MED ORDER — CARVEDILOL 6.25 MG PO TABS: 6.2500 mg | ORAL_TABLET | Freq: Two times a day (BID) | ORAL | 0 refills | Status: DC

## 2019-12-30 MED ORDER — LOSARTAN POTASSIUM 50 MG PO TABS: 50.0000 mg | ORAL_TABLET | Freq: Every day | ORAL | 0 refills | Status: DC

## 2019-12-30 MED ORDER — PREDNISONE 20 MG PO TABS: 40.0000 mg | ORAL_TABLET | Freq: Every day | ORAL | Status: DC

## 2019-12-30 MED ORDER — LOSARTAN POTASSIUM 50 MG PO TABS: 50.0000 mg | ORAL_TABLET | Freq: Every day | ORAL | Status: DC

## 2019-12-30 MED ORDER — CARVEDILOL 6.25 MG PO TABS
6.2500 mg | ORAL_TABLET | Freq: Two times a day (BID) | ORAL | Status: DC
  Administered 2019-12-30: 6.25 mg via ORAL
  Filled 2019-12-30: qty 1

## 2019-12-30 MED ORDER — AZITHROMYCIN 500 MG PO TABS: 500.0000 mg | ORAL_TABLET | Freq: Every day | ORAL | 0 refills | Status: AC

## 2019-12-30 NOTE — Discharge Instructions (Signed)
If you have scales weigh daily after going to bathroom each day.  Call out office if your wt goes up by 3 lbs in a day or 5 lbs in a week.  Low salt diet - no more than 2000 mg daily -the salt hold onto fluid so low salt,  Fast food has a lot of salt and so does processed foods.    No Work until seen in DR. Apache's office.

## 2019-12-30 NOTE — Progress Notes (Addendum)
Progress Note  Patient Name: Alex Lee Date of Encounter: 12/30/2019  [facility] Cardiologist: Skeet Latch, MD   Subjective   No chest pain and no SOB feels well. 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   Inpatient Medications    Scheduled Meds: . aspirin EC  81 mg Oral Daily  . azithromycin  500 mg Oral Daily  . carvedilol  3.125 mg Oral BID WC  . Chlorhexidine Gluconate Cloth  6 each Topical Daily  . dextromethorphan-guaiFENesin  1 tablet Oral BID  . enoxaparin (LOVENOX) injection  40 mg Subcutaneous Q24H  . ipratropium-albuterol  3 mL Nebulization BID  . losartan  25 mg Oral Daily  . predniSONE  50 mg Oral Q breakfast   Continuous Infusions: . cefTRIAXone (ROCEPHIN)  IV 1 g (12/30/19 1023)   PRN Meds: acetaminophen, albuterol, labetalol   Vital Signs    Vitals:   12/30/19 0542 12/30/19 0929 12/30/19 0931 12/30/19 1251  BP: 129/89   (!) 147/102  Pulse: 78   81  Resp: 18   20  Temp: 98 F (36.7 C)   98.5 F (36.9 C)  TempSrc: Oral   Oral  SpO2: 96% 96% 96% 96%    Intake/Output Summary (Last 24 hours) at 12/30/2019 1427 Last data filed at 12/29/2019 1727 Gross per 24 hour  Intake 240 ml  Output --  Net 240 ml   No flowsheet data found.    Telemetry    SR - Personally Reviewed  ECG    No new - Personally Reviewed  Physical Exam   GEN: No acute distress.   Neck: No JVD Cardiac: RRR, no murmurs, rubs, or gallops.  Respiratory: Clear to auscultation bilaterally. GI: Soft, nontender, non-distended  MS: No edema; No deformity. Neuro:  Nonfocal  Psych: Normal affect   Labs    High Sensitivity Troponin:   Recent Labs  Lab 12/29/19 0439  TROPONINIHS 11      Chemistry Recent Labs  Lab 12/27/19 0846 12/29/19 0417 12/30/19 0401  NA 133* 133* 138  K 3.8 4.0 3.9  CL 101 104 108  CO2 22 22 21*  GLUCOSE 108* 154* 111*  BUN 12 21* 20  CREATININE 0.99 0.82 0.71  CALCIUM 8.7* 8.7* 8.5*  PROT 7.6  --   --   ALBUMIN 4.1  --   --    AST 22  --   --   ALT 29  --   --   ALKPHOS 50  --   --   BILITOT 1.2  --   --   GFRNONAA >60 >60 >60  GFRAA >60 >60 >60  ANIONGAP 10 7 9      Hematology Recent Labs  Lab 12/27/19 0846 12/29/19 0417 12/30/19 0401  WBC 11.4* 17.8* 20.6*  RBC 4.64 4.24 4.17*  HGB 14.9 13.7 13.5  HCT 43.6 40.5 39.9  MCV 94.0 95.5 95.7  MCH 32.1 32.3 32.4  MCHC 34.2 33.8 33.8  RDW 11.7 11.7 11.6  PLT 195 242 259    BNP Recent Labs  Lab 12/27/19 1330  BNP 175.3*     DDimer  Recent Labs  Lab 12/27/19 0846  DDIMER 0.37     Radiology    CT CORONARY MORPH W/CTA COR W/SCORE W/CA W/CM &/OR WO/CM  Addendum Date: 12/29/2019   ADDENDUM REPORT: 12/29/2019 18:30 CLINICAL DATA:  47M with new onset systolic heart failure and elevated troponin. EXAM: Cardiac/Coronary  CT TECHNIQUE: The patient was scanned on a [employer]. FINDINGS: A  120 kV prospective scan was triggered in the descending thoracic aorta at 111 HU's. Axial non-contrast 3 mm slices were carried out through the heart. The data set was analyzed on a dedicated work station and scored using the Agatson method. Gantry rotation speed was 250 msecs and collimation was .6 mm. No beta blockade and 0.8 mg of sl NTG was given. The 3D data set was reconstructed in 5% intervals of the 67-82 % of the R-R cycle. Diastolic phases were analyzed on a dedicated work station using MPR, MIP and VRT modes. The patient received 80 cc of contrast. Aorta: Normal size. Ascending aorta 3.0 cm. No calcifications. No dissection. Aortic Valve:  Trileaflet.  No calcifications. Coronary Arteries:  Normal coronary origin.  Right dominance. RCA is a large dominant artery that gives rise to PDA and PLVB. There is no plaque. Left main is a large artery that gives rise to LAD and LCX arteries. LAD is a large vessel that has no plaque. There is a small D1, large branching D2 and small D3 without plaque. LCX is a non-dominant artery that gives rise to a small OM1 and  large OM2 branch. There is no plaque. Other findings: Normal pulmonary vein drainage into the left atrium. Normal let atrial appendage without a thrombus. Normal size of the pulmonary artery. IMPRESSION: 1. Coronary calcium score of 0. This was 0 percentile for age and sex matched control. 2. Normal coronary origin with right dominance. 3. No evidence of CAD. Chilton Si, MD Electronically Signed   By: Chilton Si   On: 12/29/2019 18:30   Result Date: 12/29/2019 EXAM: OVER-READ INTERPRETATION  CT CHEST The following report is an over-read performed by radiologist Dr. Leanna Battles of [facility] on 12/29/2019. This over-read does not include interpretation of cardiac or coronary anatomy or pathology. The coronary calcium score/coronary CTA interpretation by the cardiologist is attached. COMPARISON:  None. FINDINGS: Vascular: Heart is enlarged.  No pericardial effusion. Mediastinum/Nodes: None. Lungs/Pleura: Tiny subpleural lymph node along the minor fissure. Minimal dependent atelectasis bilaterally. No pleural fluid. Visualized airway is unremarkable. Upper Abdomen: None. Musculoskeletal: None. IMPRESSION: No acute extracardiac findings. Electronically Signed: By: Leanna Battles M.D. On: 12/29/2019 16:34    Cardiac Studies   Cardiac CTA   Aorta: Normal size. Ascending aorta 3.0 cm. No calcifications. No dissection.  Aortic Valve:  Trileaflet.  No calcifications.  Coronary Arteries:  Normal coronary origin.  Right dominance.  RCA is a large dominant artery that gives rise to PDA and PLVB. There is no plaque.  Left main is a large artery that gives rise to LAD and LCX arteries.  LAD is a large vessel that has no plaque. There is a small D1, large branching D2 and small D3 without plaque.  LCX is a non-dominant artery that gives rise to a small OM1 and large OM2 branch. There is no plaque.  Other findings:  Normal pulmonary vein drainage into the left  atrium.  Normal let atrial appendage without a thrombus.  Normal size of the pulmonary artery.  IMPRESSION: 1. Coronary calcium score of 0. This was 0 percentile for age and sex matched control.  2. Normal coronary origin with right dominance.  3. No evidence of CAD.  Radiology chest read, no acute extracardiac findings.     Echo 12/29/19 Echo 12/28/19  1. Severely reduced LVEF 20-25% with diffuse hypokinesis, mildly dilated  left ventricle and left atrium, grade 2 diastolic dysfunction with  elevated filling pressures. RVEF appears normal.  2. Left ventricular ejection fraction, by estimation, is 20 to 25%. The  left ventricle has severely decreased function. The left ventricle  demonstrates global hypokinesis. The left ventricular internal cavity size  was mildly dilated. Left ventricular  diastolic parameters are consistent with Grade II diastolic dysfunction  (pseudonormalization). Elevated left ventricular end-diastolic pressure.  3. Right ventricular systolic function is normal. The right ventricular  size is normal. There is normal pulmonary artery systolic pressure.  4. Left atrial size was mildly dilated.  5. The mitral valve is normal in structure. No evidence of mitral valve  regurgitation. No evidence of mitral stenosis.  6. The aortic valve is normal in structure. Aortic valve regurgitation is  mild. Mild to moderate aortic valve sclerosis/calcification is present,  without any evidence of aortic stenosis.  7. The inferior vena cava is normal in size with greater than 50%  respiratory variability, suggesting right atrial pressure of 3 mmHg.   FINDINGS  Left Ventricle: Left ventricular ejection fraction, by estimation, is 20  to 25%. The left ventricle has severely decreased function. The left  ventricle demonstrates global hypokinesis. Definity contrast agent was  given IV to delineate the left  ventricular endocardial borders. The left ventricular  internal cavity size  was mildly dilated. There is no left ventricular hypertrophy. Left  ventricular diastolic parameters are consistent with Grade II diastolic  dysfunction (pseudonormalization).  Elevated left ventricular end-diastolic pressure.   Right Ventricle: The right ventricular size is normal. No increase in  right ventricular wall thickness. Right ventricular systolic function is  normal. There is normal pulmonary artery systolic pressure.   Left Atrium: Left atrial size was mildly dilated.   Right Atrium: Right atrial size was normal in size.   Pericardium: There is no evidence of pericardial effusion.   Mitral Valve: The mitral valve is normal in structure. Normal mobility of  the mitral valve leaflets. No evidence of mitral valve regurgitation. No  evidence of mitral valve stenosis.   Tricuspid Valve: The tricuspid valve is normal in structure. Tricuspid  valve regurgitation is mild . No evidence of tricuspid stenosis.   Aortic Valve: The aortic valve is normal in structure. Aortic valve  regurgitation is mild. Mild to moderate aortic valve  sclerosis/calcification is present, without any evidence of aortic  stenosis.   Pulmonic Valve: The pulmonic valve was normal in structure. Pulmonic valve  regurgitation is not visualized. No evidence of pulmonic stenosis.   Aorta: The aortic root is normal in size and structure.   Venous: The inferior vena cava is normal in size with greater than 50%  respiratory variability, suggesting right atrial pressure of 3 mmHg.   IAS/Shunts: No atrial level shunt detected by color flow Doppler.    Patient Profile     44 y.o. male with a hx of childhood asthma, now with new cardiomyopathy NICM with EF 20-25%.    Assessment & Plan    NICM with EF 20-25% and no CAD on cardiac CTA.  On ASSA coreg 3.125 BID, and losartan 25 mg daily.  Plan to change losartan to entresto in office. And continue to titrate meds.  Plan echo in 3  months after meds titrated.  Follow up in 7-10 days with Dr. Duke Salvia or APP  ? 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  No work until seen in office  Acute systolic and diastolic HF + 406 on no diuretics stable   Acute asthma exacerbation and PNA. On sterids now oral. mucinex  HTN labile 129/89  to 147/102   Tobacco use have discussed stopping /ETOh decrease to stop.           For questions or updates, please contact [facility] Please consult [email or web address] for contact info under        Signed, Nada Boozer, NP  12/30/2019, 2:27 PM

## 2019-12-31 NOTE — Discharge Summary (Signed)
[facility] Discharge Summary   Patient: Alex Lee PPJ:[id number]  PCP: Patient, No Pcp Per  Date of admission: 12/27/2019   Date of discharge: 12/30/2019     Discharge Diagnoses:  Principal diagnosis CAP Active Problems:   Childhood asthma   Community acquired pneumonia   Acute combined systolic and diastolic heart failure (HCC)   Admitted From: home Disposition:  Home   Recommendations for Outpatient Follow-up:  1. PCP: establish care and follow up in 1 week  2. Also follow up with Cardiology to resume work and further medication adjustment as well as repeat Echocardiogram in 3-6 months.  3. Follow up LABS/TEST:  none   Follow-up Information    [facility] Follow up.   Why: please call this clinic after discharge to schedule a follow-up visit and establish primary care. Contact information: [address] [place] [address] [place] [id number] [phone]       Chilton Si, MD Follow up on 01/06/2020.   Specialty: Cardiology Why: at 10:45 AM with her nurse practitioner Ronda Fairly information: [address] [place] [address] [place] [address] [phone]              Diet recommendation: Cardiac diet  Activity: The patient is advised to gradually reintroduce usual activities, as tolerated  Discharge Condition: stable  Code Status: Full code   History of present illness: As per the H and P dictated on admission, "Alex Lee is a 44 y.o. male with a pertinent history of childhood asthma who presents to [facility] ED with shortness of breath.  Pt states that for the past 2 days he has been ahving shortness of breath, yesterday felt like his previous childhood asthma.  He was so SOB he called EMS and they provided a breathing treatment which made him feel better for a short period of time but it returned and caused him to present to the ED.  Associated symptoms include fever.    SH - patient works on [employer] but  denies any known exposures to cause this.  Denies any sick contacts.  He still smokes.  Denies any other family history of lung or liver diseases like alpha1antitrypsin  In the ED, he was tachycardic especially with ambulation, tachypneic, febrile, wbc 11.4, Na 133, Scr 0.99, LFT's okay, procal 1.1, LA 1.1, covid negative.  CXR with LUL opacity concerning for PNA.  CXR shows Concern for LEFT upper lobe pneumonia versus asymmetric pulmonary edema. ekg started on prednisone 60mg , azithromycin and ceftriaxone and albuterol which the ED provider thinks helped some.  s/p blood cultures  Talked with wife in the room.  has not been vaccinated against covid."  Hospital Course:  Summary of his active problems in the hospital is as following. 1.  Acute asthma exacerbation Sepsis secondary to community-acquired pneumonia POA. X-ray chest shows left upper lobe pneumonia fever, tachycardia and tachypnea on admission. Influenza PCR negative, SARS Covid negative. Treated with IV antibiotics. So far no growth on cultures.  Back to oral steroids given significant improvement in wheezing. Add Mucinex  2.  Elevated BNP. Cardiomyopathy Echocardiogram performed on admission Shows 20 to 35% EF with diffuse hypokinesis Cardiology consulted. Underwent coronary CT which showed coronary calcium score of 0 and no evidence of CAD. Likely nonischemic cardiomyopathy, meds adjusted, will still need further adjustment down the road for best outcomes. Clearly explained the pt importance of follow up.   3.  Active smoker. Alcohol use history. counseled the patient against smoking and recommended to  cut down on alcohol intake.   Patient was ambulatory without any assistance. On the day of the discharge the patient's vitals were stable, and no other acute medical condition were reported by patient. the patient was felt safe to be discharge at Home with no therapy needed on discharge.  Consultants:  Cardiology  Procedures: Echocardiogram  Cardiac CT  Discharge Exam: General: Appear in no distress, no Rash; Oral Mucosa Clear, moist. Cardiovascular: S1 and S2 Present, no Murmur, Respiratory: normal respiratory effort, Bilateral Air entry present and no Crackles, no wheezes Abdomen: Bowel Sound present, Soft and no tenderness, no hernia Extremities: no Pedal edema, no calf tenderness Neurology: alert and oriented to time and place affect appropriate.  There were no vitals filed for this visit. Vitals:   12/30/19 0931 12/30/19 1251  BP:  (!) 147/102  Pulse:  81  Resp:  20  Temp:  98.5 F (36.9 C)  SpO2: 96% 96%    DISCHARGE MEDICATION: Allergies as of 12/30/2019   No Known Allergies     Medication List    TAKE these medications   albuterol 108 (90 Base) MCG/ACT inhaler Commonly known as: VENTOLIN HFA Inhale 2 puffs into the lungs every 6 (six) hours as needed for wheezing or shortness of breath.   aspirin 81 MG EC tablet Take 1 tablet (81 mg total) by mouth daily. Swallow whole.   azithromycin 500 MG tablet Commonly known as: ZITHROMAX Take 1 tablet (500 mg total) by mouth daily for 3 days.   carvedilol 6.25 MG tablet Commonly known as: COREG Take 1 tablet (6.25 mg total) by mouth 2 (two) times daily with a meal.   cephALEXin 500 MG capsule Commonly known as: KEFLEX Take 1 capsule (500 mg total) by mouth 3 (three) times daily for 3 days. What changed:   medication strength  how much to take  when to take this   dextromethorphan-guaiFENesin 30-600 MG 12hr tablet Commonly known as: MUCINEX DM Take 1 tablet by mouth 2 (two) times daily for 7 days.   losartan 50 MG tablet Commonly known as: COZAAR Take 1 tablet (50 mg total) by mouth daily.   predniSONE 10 MG tablet Commonly known as: DELTASONE Take  daily for 3days,Take  daily for 3days,Take  daily for 3days, then stop      No Known Allergies Discharge Instructions    Diet - low  sodium heart healthy   Complete by: As directed    Increase activity slowly   Complete by: As directed       The results of significant diagnostics from this hospitalization (including imaging, microbiology, ancillary and laboratory) are listed below for reference.    Significant Diagnostic Studies: DG Chest 2 View  Result Date: 12/27/2019 CLINICAL DATA:  Short of breath.  Cough EXAM: CHEST - 2 VIEW COMPARISON:  None. FINDINGS: Normal cardiac silhouette. There is diffuse airspace density in the LEFT upper lobe. RIGHT lung clear. Lung bases clear. IMPRESSION: Concern for LEFT upper lobe pneumonia versus asymmetric pulmonary edema. Electronically Signed   By: Genevive Bi M.D.   On: 12/27/2019 08:11   CT CORONARY MORPH W/CTA COR W/SCORE W/CA W/CM &/OR WO/CM  Addendum Date: 12/29/2019   ADDENDUM REPORT: 12/29/2019 18:30 CLINICAL DATA:  25M with new onset systolic heart failure and elevated troponin. EXAM: Cardiac/Coronary  CT TECHNIQUE: The patient was scanned on a [employer]. FINDINGS: A 120 kV prospective scan was triggered in the descending thoracic aorta at 111 HU's. Axial non-contrast 3 mm slices  were carried out through the heart. The data set was analyzed on a dedicated work station and scored using the Agatson method. Gantry rotation speed was 250 msecs and collimation was .6 mm. No beta blockade and 0.8 mg of sl NTG was given. The 3D data set was reconstructed in 5% intervals of the 67-82 % of the R-R cycle. Diastolic phases were analyzed on a dedicated work station using MPR, MIP and VRT modes. The patient received 80 cc of contrast. Aorta: Normal size. Ascending aorta 3.0 cm. No calcifications. No dissection. Aortic Valve:  Trileaflet.  No calcifications. Coronary Arteries:  Normal coronary origin.  Right dominance. RCA is a large dominant artery that gives rise to PDA and PLVB. There is no plaque. Left main is a large artery that gives rise to LAD and LCX arteries. LAD is a  large vessel that has no plaque. There is a small D1, large branching D2 and small D3 without plaque. LCX is a non-dominant artery that gives rise to a small OM1 and large OM2 branch. There is no plaque. Other findings: Normal pulmonary vein drainage into the left atrium. Normal let atrial appendage without a thrombus. Normal size of the pulmonary artery. IMPRESSION: 1. Coronary calcium score of 0. This was 0 percentile for age and sex matched control. 2. Normal coronary origin with right dominance. 3. No evidence of CAD. Chilton Si, MD Electronically Signed   By: Chilton Si   On: 12/29/2019 18:30   Result Date: 12/29/2019 EXAM: OVER-READ INTERPRETATION  CT CHEST The following report is an over-read performed by radiologist Dr. Leanna Battles of [facility] on 12/29/2019. This over-read does not include interpretation of cardiac or coronary anatomy or pathology. The coronary calcium score/coronary CTA interpretation by the cardiologist is attached. COMPARISON:  None. FINDINGS: Vascular: Heart is enlarged.  No pericardial effusion. Mediastinum/Nodes: None. Lungs/Pleura: Tiny subpleural lymph node along the minor fissure. Minimal dependent atelectasis bilaterally. No pleural fluid. Visualized airway is unremarkable. Upper Abdomen: None. Musculoskeletal: None. IMPRESSION: No acute extracardiac findings. Electronically Signed: By: Leanna Battles M.D. On: 12/29/2019 16:34   ECHOCARDIOGRAM COMPLETE  Result Date: 12/28/2019    ECHOCARDIOGRAM REPORT   Patient Name:   SHAYMUS PEPPLE Date of Exam: 12/28/2019 Medical Rec #:  [id number]    Height: Accession #:    [id number]   Weight: Date of Birth:  1976-02-13    BSA: Patient Age:    44 years     BP:           158/113 mmHg Patient Gender: M            HR:           98 bpm. Exam Location:  Inpatient Procedure: 2D Echo and Intracardiac Opacification Agent Indications:    Abnormal ECG 794.31 / R94.31  History:        Patient has no prior history of  Echocardiogram examinations.                 Signs/Symptoms:Shortness of Breath; Risk Factors:Current Smoker.                 Childhood asthma.  Sonographer:    [name] RDCS Referring Phys: [id number] Charlane Ferretti IMPRESSIONS  1. Severely reduced LVEF 20-25% with diffuse hypokinesis, mildly dilated left ventricle and left atrium, grade 2 diastolic dysfunction with elevated filling pressures. RVEF appears normal.  2. Left ventricular ejection fraction, by estimation, is 20 to 25%. The left ventricle has severely decreased function.  The left ventricle demonstrates global hypokinesis. The left ventricular internal cavity size was mildly dilated. Left ventricular diastolic parameters are consistent with Grade II diastolic dysfunction (pseudonormalization). Elevated left ventricular end-diastolic pressure.  3. Right ventricular systolic function is normal. The right ventricular size is normal. There is normal pulmonary artery systolic pressure.  4. Left atrial size was mildly dilated.  5. The mitral valve is normal in structure. No evidence of mitral valve regurgitation. No evidence of mitral stenosis.  6. The aortic valve is normal in structure. Aortic valve regurgitation is mild. Mild to moderate aortic valve sclerosis/calcification is present, without any evidence of aortic stenosis.  7. The inferior vena cava is normal in size with greater than 50% respiratory variability, suggesting right atrial pressure of 3 mmHg. FINDINGS  Left Ventricle: Left ventricular ejection fraction, by estimation, is 20 to 25%. The left ventricle has severely decreased function. The left ventricle demonstrates global hypokinesis. Definity contrast agent was given IV to delineate the left ventricular endocardial borders. The left ventricular internal cavity size was mildly dilated. There is no left ventricular hypertrophy. Left ventricular diastolic parameters are consistent with Grade II diastolic dysfunction (pseudonormalization).  Elevated left ventricular end-diastolic pressure. Right Ventricle: The right ventricular size is normal. No increase in right ventricular wall thickness. Right ventricular systolic function is normal. There is normal pulmonary artery systolic pressure. Left Atrium: Left atrial size was mildly dilated. Right Atrium: Right atrial size was normal in size. Pericardium: There is no evidence of pericardial effusion. Mitral Valve: The mitral valve is normal in structure. Normal mobility of the mitral valve leaflets. No evidence of mitral valve regurgitation. No evidence of mitral valve stenosis. Tricuspid Valve: The tricuspid valve is normal in structure. Tricuspid valve regurgitation is mild . No evidence of tricuspid stenosis. Aortic Valve: The aortic valve is normal in structure. Aortic valve regurgitation is mild. Mild to moderate aortic valve sclerosis/calcification is present, without any evidence of aortic stenosis. Pulmonic Valve: The pulmonic valve was normal in structure. Pulmonic valve regurgitation is not visualized. No evidence of pulmonic stenosis. Aorta: The aortic root is normal in size and structure. Venous: The inferior vena cava is normal in size with greater than 50% respiratory variability, suggesting right atrial pressure of 3 mmHg. IAS/Shunts: No atrial level shunt detected by color flow Doppler.  LEFT VENTRICLE PLAX 2D LVIDd:         5.50 cm      Diastology LVIDs:         4.70 cm      LV e' lateral:   6.96 cm/s LV PW:         0.90 cm      LV E/e' lateral: 15.8 LV IVS:        0.80 cm      LV e' medial:    6.09 cm/s LVOT diam:     2.10 cm      LV E/e' medial:  18.1 LV SV:         56 LVOT Area:     3.46 cm                              3D Volume EF LV Volumes (MOD)            LV 3D EF:    -8.50 % LV vol d, MOD A2C: 232.0 ml LV 3D EDV:   215900.00 mm LV vol d, MOD A4C: 201.0 ml LV 3D  ESV:   234300.00 mm LV vol s, MOD A2C: 164.0 ml LV 3D SV:    -18400.00 mm LV vol s, MOD A4C: 133.0 ml LV SV MOD A2C:      68.0 ml LV SV MOD A4C:     201.0 ml LV SV MOD BP:      66.7 ml RIGHT VENTRICLE RV S prime:     10.60 cm/s TAPSE (M-mode): 2.1 cm LEFT ATRIUM             RIGHT ATRIUM LA diam:        4.00 cm RA Area:     12.90 cm LA Vol (A2C):   83.7 ml RA Volume:   31.00 ml LA Vol (A4C):   61.0 ml LA Biplane Vol: 75.9 ml  AORTIC VALVE LVOT Vmax:   101.00 cm/s LVOT Vmean:  73.800 cm/s LVOT VTI:    0.163 m  AORTA Ao Root diam: 2.90 cm MITRAL VALVE MV Area (PHT): 7.99 cm     SHUNTS MV Decel Time: 95 msec      Systemic VTI:  0.16 m MV E velocity: 110.00 cm/s  Systemic Diam: 2.10 cm MV A velocity: 60.90 cm/s MV E/A ratio:  1.81 Tobias Alexander MD Electronically signed by Tobias Alexander MD Signature Date/Time: 12/28/2019/12:59:41 PM    Final     Microbiology: Recent Results (from the past 240 hour(s))  SARS Coronavirus 2 by RT PCR (hospital order, performed in [facility] hospital lab) Nasopharyngeal Nasopharyngeal Swab     Status: None   Collection Time: 12/27/19  8:46 AM   Specimen: Nasopharyngeal Swab  Result Value Ref Range Status   SARS Coronavirus 2 NEGATIVE NEGATIVE Final    Comment: (NOTE) SARS-CoV-2 target nucleic acids are NOT DETECTED.  The SARS-CoV-2 RNA is generally detectable in upper and lower respiratory specimens during the acute phase of infection. The lowest concentration of SARS-CoV-2 viral copies this assay can detect is 250 copies / mL. A negative result does not preclude SARS-CoV-2 infection and should not be used as the sole basis for treatment or other patient management decisions.  A negative result may occur with improper specimen collection / handling, submission of specimen other than nasopharyngeal swab, presence of viral mutation(s) within the areas targeted by this assay, and inadequate number of viral copies (<250 copies / mL). A negative result must be combined with clinical observations, patient history, and epidemiological information.  Fact Sheet for Patients:    BoilerBrush.com.cy  Fact Sheet for Healthcare Providers: https://pope.com/  This test is not yet approved or  cleared by the [place] FDA and has been authorized for detection and/or diagnosis of SARS-CoV-2 by FDA under an Emergency Use Authorization (EUA).  This EUA will remain in effect (meaning this test can be used) for the duration of the COVID-19 declaration under Section 564(b)(1) of the Act, 21 U.S.C. section 360bbb-3(b)(1), unless the authorization is terminated or revoked sooner.  Performed at [facility], [address], [place], [place] [address]   Blood Culture (routine x 2)     Status: None (Preliminary result)   Collection Time: 12/27/19  8:46 AM   Specimen: BLOOD  Result Value Ref Range Status   Specimen Description   Final    BLOOD LEFT HAND Performed at [facility], [address] [place], [place] [address]    Special Requests   Final    BOTTLES DRAWN AEROBIC AND ANAEROBIC Blood Culture results may not be optimal due to an  excessive volume of blood received in culture bottles Performed at [facility], [address] [place], [place] [address]    Culture   Final    NO GROWTH 4 DAYS Performed at [facility], [address] [place], [place] [address]    Report Status PENDING  Incomplete  Blood Culture (routine x 2)     Status: None (Preliminary result)   Collection Time: 12/27/19  8:52 AM   Specimen: BLOOD  Result Value Ref Range Status   Specimen Description   Final    BLOOD LEFT ANTECUBITAL Performed at [facility], [address], [place], [place] [address]    Special Requests   Final    BOTTLES DRAWN AEROBIC AND ANAEROBIC Blood Culture results may not be optimal due to an excessive volume of blood received in culture bottles Performed at [facility], [address], [place], [place] [address]     Culture   Final    NO GROWTH 4 DAYS Performed at [facility], [address], [place], [place] [address]    Report Status PENDING  Incomplete  Expectorated sputum assessment w rflx to resp cult     Status: None   Collection Time: 12/28/19  7:22 AM   Specimen: Sputum  Result Value Ref Range Status   Specimen Description SPUTUM  Final   Special Requests EXPECTORANT  Final   Sputum evaluation   Final    THIS SPECIMEN IS ACCEPTABLE FOR SPUTUM CULTURE Performed at [facility], [address], [place], [place] [address]    Report Status 12/28/2019 FINAL  Final  Culture, respiratory     Status: None   Collection Time: 12/28/19  7:22 AM   Specimen: SPU  Result Value Ref Range Status   Specimen Description   Final    SPUTUM Performed at [facility], [address] [place], [place] [address]    Special Requests   Final    EXPECTORANT Reflexed from [phone] Performed at [facility], [address], [place], [place] [address]    Gram Stain   Final    FEW WBC PRESENT, PREDOMINANTLY PMN MODERATE GRAM POSITIVE RODS FEW GRAM NEGATIVE RODS    Culture   Final    FEW Consistent with normal respiratory flora. Performed at [facility], [address], [place], [place] [address]    Report Status 12/30/2019 FINAL  Final     Labs: CBC: Recent Labs  Lab 12/27/19 0846 12/29/19 0417 12/30/19 0401  WBC 11.4* 17.8* 20.6*  NEUTROABS 9.0*  --   --   HGB 14.9 13.7 13.5  HCT 43.6 40.5 39.9  MCV 94.0 95.5 95.7  PLT 195 242 259   Basic Metabolic Panel: Recent Labs  Lab 12/27/19 0846 12/29/19 0417 12/30/19 0401  NA 133* 133* 138  K 3.8 4.0 3.9  CL 101 104 108  CO2 22 22 21*  GLUCOSE 108* 154* 111*  BUN 12 21* 20  CREATININE 0.99 0.82 0.71  CALCIUM 8.7* 8.7* 8.5*  MG  --  2.2  --    Liver Function Tests: Recent Labs  Lab 12/27/19 0846  AST 22  ALT 29  ALKPHOS 50  BILITOT 1.2  PROT 7.6  ALBUMIN 4.1    No results for input(s): LIPASE, AMYLASE in the last 168 hours. No results for input(s): AMMONIA in the last 168 hours. Cardiac Enzymes: No results for input(s): CKTOTAL, CKMB, CKMBINDEX,  TROPONINI in the last 168 hours. BNP (last 3 results) Recent Labs    12/27/19 1330  BNP 175.3*   CBG: No results for input(s): GLUCAP in the last 168 hours.  Time spent: 35 minutes  Signed:  Berle Mull  [facility] 12/30/2019 6:41 PM

## 2020-01-01 LAB — CULTURE, BLOOD (ROUTINE X 2)
Culture: NO GROWTH
Culture: NO GROWTH

## 2020-01-03 NOTE — Progress Notes (Signed)
Cardiology Clinic Note   Patient Name: Alex Lee Date of Encounter: 01/06/2020  Primary Care Provider:  Patient, No Pcp Per Primary Cardiologist:  Skeet Latch, MD  Patient Profile    Alex Lee 44 year old male presents today for follow-up evaluation of his acute combined systolic and diastolic heart failure.  Past Medical History    Past Medical History:  Diagnosis Date  . Childhood asthma   . Tobacco abuse    History reviewed. No pertinent surgical history.  Allergies  No Known Allergies  History of Present Illness    [name] has a past medical history of community-acquired pneumonia, asthma, and acute systolic and diastolic heart failure.  He was admitted to the hospital on 12/27/19-12/30/19.  He presented to the emergency department with complaints of shortness of breath x2 days.  He indicated that he had childhood asthma.  He also had a fever at the time.  He denied any sick contacts.  In the emergency department he was tachycardic, febrile, white blood cell count was 11.4, NA 133, CXR showed left upper lobe opacities concerning for pneumonia.  He was placed on prednisone, azithromycin, and ceftriaxone.  An echocardiogram performed on admission showed an EF of 20-35% with diffuse hypokinesis.  He underwent coronary CT which showed a calcium score of 0 no evidence of coronary artery disease.  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  He is also an active smoker and uses alcohol.  He was counseled on cessation.  He presents the clinic today for follow-up evaluation and states he has had 1 episode of chest pain at rest last week.  He states the chest pain started in his left pectoral region was constant, went away with rest, and he did not take anything for the discomfort.  He states that he has not been physically active since being discharged from the hospital due to restrictions that were placed.  He is instructed to monitor his blood pressure at home  and keep a blood pressure log.  Follow a low-sodium diet, and increase his physical activity as tolerated.  I will stop his losartan and start him on Entresto, check a BMP in 2 weeks, and have him follow-up in 4 weeks.  I will also give him a work note that explains we are increasing medication related to his heart failure.  We will reevaluate if he can return to work in 4 weeks.  He works in [employer] on [employer].  Today he denies chest pain, shortness of breath, lower extremity edema, fatigue, palpitations, melena, hematuria, hemoptysis, diaphoresis, weakness, presyncope, syncope, orthopnea, and PND.     Home Medications    Prior to Admission medications   Medication Sig Start Date End Date Taking? Authorizing Provider  albuterol (VENTOLIN HFA) 108 (90 Base) MCG/ACT inhaler Inhale 2 puffs into the lungs every 6 (six) hours as needed for wheezing or shortness of breath. 12/30/19   Lavina Hamman, MD  aspirin EC 81 MG EC tablet Take 1 tablet (81 mg total) by mouth daily. Swallow whole. 12/31/19   Lavina Hamman, MD  carvedilol (COREG) 6.25 MG tablet Take 1 tablet (6.25 mg total) by mouth 2 (two) times daily with a meal. 12/30/19   Lavina Hamman, MD  dextromethorphan-guaiFENesin [facility] DM) 30-600 MG 12hr tablet Take 1 tablet by mouth 2 (two) times daily for 7 days. 12/30/19 01/06/20  Lavina Hamman, MD  losartan (COZAAR) 50 MG tablet Take 1 tablet (50 mg total) by mouth  daily. 12/31/19   Rolly Salter, MD  predniSONE (DELTASONE) 10 MG tablet Take 30mg  daily for 3days,Take 20mg  daily for 3days,Take 10mg  daily for 3days, then stop 12/30/19   , MD    Family History    Family History  Problem Relation Age of Onset  . Hypertension Mother   . Hypertension Father    He indicated that his mother is alive. He indicated that his father is alive.  Social History    Social History   Socioeconomic History  . Marital status: Married    Spouse name: Not on  file  . Number of children: Not on file  . Years of education: Not on file  . Highest education level: Not on file  Occupational History  . Not on file  Tobacco Use  . Smoking status: Current Every Day Smoker    Packs/day: 0.50  . Smokeless tobacco: Never Used  Substance and Sexual Activity  . Alcohol use: Yes    Alcohol/week: 10.0 standard drinks    Types: 10 Cans of beer per week  . Drug use: Not Currently  . Sexual activity: Not on file  Other Topics Concern  . Not on file  Social History Narrative  . Not on file   Social Determinants of Health   Financial Resource Strain:   . Difficulty of Paying Living Expenses:   Food Insecurity:   . Worried About in the Last Year:   . 01/01/20 in the Last Year:   Transportation Needs:   . Rolly Salter (Medical):   Programme researcher, broadcasting/film/video Lack of Transportation (Non-Medical):   Physical Activity:   . Days of Exercise per Week:   . Minutes of Exercise per Session:   Stress:   . Feeling of Stress :   Social Connections:   . Frequency of Communication with Friends and Family:   . Frequency of Social Gatherings with Friends and Family:   . Attends Religious Services:   . Active Member of Clubs or Organizations:   . Attends Barista Meetings:   Freight forwarder Marital Status:   Intimate Partner Violence:   . Fear of Current or Ex-Partner:   . Emotionally Abused:   Marland Kitchen Physically Abused:   . Sexually Abused:      Review of Systems    General:  No chills, fever, night sweats or weight changes.  Cardiovascular:  No chest pain, dyspnea on exertion, edema, orthopnea, palpitations, paroxysmal nocturnal dyspnea. Dermatological: No rash, lesions/masses Respiratory: No cough, dyspnea Urologic: No hematuria, dysuria Abdominal:   No nausea, vomiting, diarrhea, bright red blood per rectum, melena, or hematemesis Neurologic:  No visual changes, wkns, changes in mental status. All other systems reviewed and are otherwise  negative except as noted above.  Physical Exam    VS:  BP (!) 142/96 (BP Location: Left Arm, Patient Position: Sitting, Cuff Size: Normal)   Pulse 83   Ht 5\' 7"  (1.702 m)   Wt 200 lb (90.7 kg)   SpO2 96%   BMI 31.32 kg/m  , BMI Body mass index is 31.32 kg/m. GEN: Well nourished, well developed, in no acute distress. HEENT: normal. Neck: Supple, no JVD, carotid bruits, or masses. Cardiac: RRR, no murmurs, rubs, or gallops. No clubbing, cyanosis, edema.  Radials/DP/PT 2+ and equal bilaterally.  Respiratory:  Respirations regular and unlabored, clear to auscultation bilaterally. GI: Soft, nontender, nondistended, BS + x 4. MS: no deformity or atrophy. Skin: warm and dry, no  rash. Neuro:  Strength and sensation are intact. Psych: Normal affect.  Accessory Clinical Findings    ECG personally reviewed by me today-none today.  EKG 12/30/2019 Sinus tachycardia 112 bpm   Echocardiogram 12/28/2019 IMPRESSIONS    1. Severely reduced LVEF 20-25% with diffuse hypokinesis, mildly dilated  left ventricle and left atrium, grade 2 diastolic dysfunction with  elevated filling pressures. RVEF appears normal.  2. Left ventricular ejection fraction, by estimation, is 20 to 25%. The  left ventricle has severely decreased function. The left ventricle  demonstrates global hypokinesis. The left ventricular internal cavity size  was mildly dilated. Left ventricular  diastolic parameters are consistent with Grade II diastolic dysfunction  (pseudonormalization). Elevated left ventricular end-diastolic pressure.  3. Right ventricular systolic function is normal. The right ventricular  size is normal. There is normal pulmonary artery systolic pressure.  4. Left atrial size was mildly dilated.  5. The mitral valve is normal in structure. No evidence of mitral valve  regurgitation. No evidence of mitral stenosis.  6. The aortic valve is normal in structure. Aortic valve regurgitation is  mild.  Mild to moderate aortic valve sclerosis/calcification is present,  without any evidence of aortic stenosis.  7. The inferior vena cava is normal in size with greater than 50%  respiratory variability, suggesting right atrial pressure of 3 mmHg.  Coronary CTA 12/29/2019 FINDINGS: A 120 kV prospective scan was triggered in the descending thoracic aorta at 111 HU's. Axial non-contrast 3 mm slices were carried out through the heart. The data set was analyzed on a dedicated work station and scored using the Agatson method. Gantry rotation speed was 250 msecs and collimation was .6 mm. No beta blockade and 0.8 mg of sl NTG was given. The 3D data set was reconstructed in 5% intervals of the 67-82 % of the R-R cycle. Diastolic phases were analyzed on a dedicated work station using MPR, MIP and VRT modes. The patient received 80 cc of contrast.  Aorta: Normal size. Ascending aorta 3.0 cm. No calcifications. No dissection.  Aortic Valve:  Trileaflet.  No calcifications.  Coronary Arteries:  Normal coronary origin.  Right dominance.  RCA is a large dominant artery that gives rise to PDA and PLVB. There is no plaque.  Left main is a large artery that gives rise to LAD and LCX arteries.  LAD is a large vessel that has no plaque. There is a small D1, large branching D2 and small D3 without plaque.  LCX is a non-dominant artery that gives rise to a small OM1 and large OM2 branch. There is no plaque.  Other findings:  Normal pulmonary vein drainage into the left atrium.  Normal let atrial appendage without a thrombus.  Normal size of the pulmonary artery.  IMPRESSION: 1. Coronary calcium score of 0. This was 0 percentile for age and sex matched control.  2. Normal coronary origin with right dominance.  3. No evidence of CAD.  Assessment & Plan   1.  Acute combined systolic and diastolic heart failure-no increased activity intolerance or dyspnea today.   Echocardiogram 12/28/2019 showed LVEF of 20-25%, global hypokinesis, mildly dilated left ventricle, G2 DD, and left atrium mildly dilated.  CT coronary showed calcium score of 0 and no CAD.  Instructed to obtain a blood pressure cuff Continue carvedilol, and aspirin Stop losartan Start Entresto 24/26 Heart healthy low-sodium diet-salty 6 given Increase physical activity as tolerated Order BMP in 2 weeks Repeat echocardiogram 1 month after medications have been optimized Blood  pressure log given  Community-acquired pneumonia-breathing much better today.  Chest x-ray 12/28/2019 showed left upper lobe opacity. Continue albuterol, prednisone Increase physical activity as tolerated Continue p.o. hydration   Essential hypertension-BP today 142/96.  Well-controlled at home. Continue carvedilol Start Entresto Heart healthy low-sodium diet-salty 6 given Increase physical activity as tolerated  Work note provided.  We will reevaluate ability to return to work in 4 weeks.  Disposition: Follow-up with me in 4 weeks.   Thomasene Ripple. Thelonious Kauffmann NP-C    01/06/2020, 10:49 AM [facility] [address] Office [phone] Fax [phone]

## 2020-01-06 ENCOUNTER — Ambulatory Visit (INDEPENDENT_AMBULATORY_CARE_PROVIDER_SITE_OTHER): Payer: 59 | Admitting: General Practice

## 2020-01-06 ENCOUNTER — Other Ambulatory Visit: Payer: Self-pay

## 2020-01-06 ENCOUNTER — Encounter: Payer: Self-pay | Admitting: General Practice

## 2020-01-06 VITALS — BP 142/96 | HR 83 | Ht 67.0 in | Wt 200.0 lb

## 2020-01-06 DIAGNOSIS — J189 Pneumonia, unspecified organism: Secondary | ICD-10-CM | POA: Diagnosis not present

## 2020-01-06 DIAGNOSIS — I10 Essential (primary) hypertension: Secondary | ICD-10-CM | POA: Diagnosis not present

## 2020-01-06 DIAGNOSIS — I5041 Acute combined systolic (congestive) and diastolic (congestive) heart failure: Secondary | ICD-10-CM

## 2020-01-06 DIAGNOSIS — Z79899 Other long term (current) drug therapy: Secondary | ICD-10-CM | POA: Diagnosis not present

## 2020-01-06 MED ORDER — ENTRESTO 24-26 MG PO TABS: 1.0000 | ORAL_TABLET | Freq: Two times a day (BID) | ORAL | 3 refills | Status: DC

## 2020-01-06 NOTE — Patient Instructions (Signed)
Medication Instructions:  START ENTRESTO 24/26MG  TWICE DAILY *If you need a refill on your cardiac medications before your next appointment, please call your pharmacy*  Lab Work: BMET IN 2 WEEKS-THIS IS NOT FASTING If you have labs (blood work) drawn today and your tests are completely normal, you will receive your results only by:  MyChart Message (if you have MyChart) OR A paper copy in the mail.  If you have any lab test that is abnormal or we need to change your treatment, we will call you to review the results. You may go to any [facility] that is convenient for you however, we do have a lab in our office that is able to assist you. You DO NOT need an appointment for our lab. The lab is open 8:00am and closes at 4:00pm. Lunch 12:45 - 1:45pm.  Special Instructions  PLEASE READ AND FOLLOW SALTY 6-ATTACHED  PLEASE INCREASE PHYSICAL ACTIVITY AS TOLERATED START AT 15 MINUTES DAILY THEN INCREASE WEEKLY  TAKE AND LOG BLOOD PRESSURE DAILY  Follow-Up: Your next appointment:  4 week(s)  In Person with Alex Si, MD  At [facility], you and your health needs are our priority.  As part of our continuing mission to provide you with exceptional heart care, we have created designated Provider Care Teams.  These Care Teams include your primary Cardiologist (physician) and Advanced Practice Providers (APPs -  Physician Assistants and Nurse Practitioners) who all work together to provide you with the care you need, when you need it.

## 2020-01-07 ENCOUNTER — Telehealth: Payer: Self-pay | Admitting: General Practice

## 2020-01-07 MED ORDER — ENTRESTO 24-26 MG PO TABS: 1.0000 | ORAL_TABLET | Freq: Two times a day (BID) | ORAL | 3 refills | Status: DC

## 2020-01-07 NOTE — Telephone Encounter (Signed)
   *  STAT* If patient is at the pharmacy, call can be transferred to refill team.   1. Which medications need to be refilled? (please list name of each medication and dose if known)    sacubitril-valsartan (ENTRESTO) 24-26 MG     2. Which pharmacy/location (including street and city if local pharmacy) is medication to be sent to? [id number] - [place], [place] - [address] AT [facility] OF [address]  3. Do they need a 30 day or 90 day supply? 30 days  Per pharmacy they did not receive prescription

## 2020-01-07 NOTE — Telephone Encounter (Signed)
Rx(s) sent to pharmacy electronically.  

## 2020-01-09 ENCOUNTER — Telehealth: Payer: Self-pay | Admitting: Licensed Clinical Social Worker

## 2020-01-09 NOTE — Telephone Encounter (Signed)
CSW referred to assist patient with obtaining a BP cuff. CSW contacted patient to inform cuff will be delivered to home. Patient grateful for support and assistance. CSW available as needed. Jackie Asbury Hair, LCSW, CCSW-MCS [phone]  

## 2020-01-20 LAB — BASIC METABOLIC PANEL
BUN/Creatinine Ratio: 15 (ref 9–20)
BUN: 13 mg/dL (ref 6–24)
CO2: 22 mmol/L (ref 20–29)
Calcium: 9.4 mg/dL (ref 8.7–10.2)
Chloride: 106 mmol/L (ref 96–106)
Creatinine, Ser: 0.87 mg/dL (ref 0.76–1.27)
GFR calc Af Amer: 120 mL/min/{1.73_m2} (ref >59)
GFR calc non Af Amer: 104 mL/min/{1.73_m2} (ref >59)
Glucose: 88 mg/dL (ref 65–99)
Potassium: 4.3 mmol/L (ref 3.5–5.2)
Sodium: 140 mmol/L (ref 134–144)

## 2020-02-02 ENCOUNTER — Telehealth: Payer: Self-pay | Admitting: General Practice

## 2020-02-02 NOTE — Telephone Encounter (Signed)
Patient wanted to know if Alex Lee has signed and submitted the paperwork for his job yet. He states he dropped off the paperwork last week. His job is asking about it. Please call to inform patient of the status of the paperwork

## 2020-02-02 NOTE — Telephone Encounter (Signed)
Left message to call back.  Paperwork scanned into chart-looks like it was faxed on 7/23

## 2020-02-02 NOTE — Telephone Encounter (Signed)
Patient called back returning a call from our office °

## 2020-02-03 NOTE — Progress Notes (Signed)
Cardiology Clinic Note   Patient Name: Alex Lee Date of Encounter: 02/04/2020  Primary Care Provider:  Patient, No Pcp Per Primary Cardiologist:  Chilton Si, MD  Patient Profile    Alex Lee 44 year old male presents today for follow-up evaluation of his acute combined systolic and diastolic heart failure.  Past Medical History    Past Medical History:  Diagnosis Date  . Childhood asthma   . Tobacco abuse    History reviewed. No pertinent surgical history.  Allergies  No Known Allergies  History of Present Illness    Mr. Sudbury has a past medical history of community-acquired pneumonia, asthma, and acute systolic and diastolic heart failure.  He was admitted to the hospital on 12/27/19-12/30/19.  He presented to the emergency department with complaints of shortness of breath x2 days.  He indicated that he had childhood asthma.  He also had a fever at the time.  He denied any sick contacts.  In the emergency department he was tachycardic, febrile, white blood cell count was 11.4, NA 133, CXR showed left upper lobe opacities concerning for pneumonia.  He was placed on prednisone, azithromycin, and ceftriaxone.  An echocardiogram performed on admission showed an EF of 20-35% with diffuse hypokinesis.  He underwent coronary CT which showed a calcium score of 0 no evidence of coronary artery disease.  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  He is also an active smoker and uses alcohol.  He was counseled on cessation.  He presented to the clinic 01/06/2020 for follow-up evaluation and stated he has had 1 episode of chest pain at rest last week.  He stated the chest pain started in his left pectoral region was constant, went away with rest, and he did not take anything for the discomfort.  He stated that he had not been physically active since being discharged from the hospital due to restrictions that were placed.  He was instructed to monitor his blood  pressure at home and keep a blood pressure log.  Follow a low-sodium diet, and increase his physical activity as tolerated.  I  stopped his losartan and started him on Entresto, checked a BMP , and had planned follow-up for 4 weeks.  I gave him a work note that explained we are increasing medication related to his heart failure.  We planned to reevaluate if he could return to work in 4 weeks.  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  BMP stable with creatinine 0.87.  He presents to the clinic today for follow-up evaluation and states he has started to increase his physical activity.  He has been walking for 15 minutes daily with his dogs.  He states he has had 2 episodes of sharp chest pain that last only a few seconds while he is at rest.  He states that the pain is sharp and increases with deep breaths.  He has been watching his diet closely and has not had any side effects from his medications.  I will increase his Entresto to 49/51, have him repeat a BMP in 2 weeks, continue his low-salt diet, and increase his physical activity as tolerated.  He also comes with short-term disability paperwork today which I will fill out.  I will have him follow-up in 4 weeks.  Today he denies chest pain, shortness of breath, lower extremity edema, fatigue, palpitations, melena, hematuria, hemoptysis, diaphoresis, weakness, presyncope, syncope, orthopnea, and PND.  Home Medications    Prior to Admission medications  Medication Sig Start Date End Date Taking? Authorizing Provider  albuterol (VENTOLIN HFA) 108 (90 Base) MCG/ACT inhaler Inhale 2 puffs into the lungs every 6 (six) hours as needed for wheezing or shortness of breath. 12/30/19   Rolly Salter, MD  aspirin EC 81 MG EC tablet Take 1 tablet (81 mg total) by mouth daily. Swallow whole. 12/31/19   Rolly Salter, MD  carvedilol (COREG) 6.25 MG tablet Take 1 tablet (6.25 mg total) by mouth 2 (two) times daily with a meal. 12/30/19   Rolly Salter, MD  losartan (COZAAR) 50 MG tablet Take 1 tablet (50 mg total) by mouth daily. 12/31/19   Rolly Salter, MD  predniSONE (DELTASONE) 10 MG tablet Take 30mg  daily for 3days,Take 20mg  daily for 3days,Take 10mg  daily for 3days, then stop 12/30/19   , MD  sacubitril-valsartan (ENTRESTO) 24-26 MG Take 1 tablet by mouth 2 (two) times daily. 01/07/20   01/01/20, MD    Family History    Family History  Problem Relation Age of Onset  . Hypertension Mother   . Hypertension Father    He indicated that his mother is alive. He indicated that his father is alive.  Social History    Social History   Socioeconomic History  . Marital status: Married    Spouse name: Not on file  . Number of children: Not on file  . Years of education: Not on file  . Highest education level: Not on file  Occupational History  . Not on file  Tobacco Use  . Smoking status: Current Every Day Smoker    Packs/day: 0.50  . Smokeless tobacco: Never Used  Substance and Sexual Activity  . Alcohol use: Yes    Alcohol/week: 10.0 standard drinks    Types: 10 Cans of beer per week  . Drug use: Not Currently  . Sexual activity: Not on file  Other Topics Concern  . Not on file  Social History Narrative  . Not on file   Social Determinants of Health   Financial Resource Strain:   . Difficulty of Paying Living Expenses:   Food Insecurity:   . Worried About Rolly Salter in the Last Year:   . 01/09/20 in the Last Year:   Transportation Needs:   . Chilton Si (Medical):   Programme researcher, broadcasting/film/video Lack of Transportation (Non-Medical):   Physical Activity:   . Days of Exercise per Week:   . Minutes of Exercise per Session:   Stress:   . Feeling of Stress :   Social Connections:   . Frequency of Communication with Friends and Family:   . Frequency of Social Gatherings with Friends and Family:   . Attends Religious Services:   . Active Member of Clubs or Organizations:   . Attends  Barista Meetings:   Freight forwarder Marital Status:   Intimate Partner Violence:   . Fear of Current or Ex-Partner:   . Emotionally Abused:   Marland Kitchen Physically Abused:   . Sexually Abused:      Review of Systems    General:  No chills, fever, night sweats or weight changes.  Cardiovascular:  No chest pain, dyspnea on exertion, edema, orthopnea, palpitations, paroxysmal nocturnal dyspnea. Dermatological: No rash, lesions/masses Respiratory: No cough, dyspnea Urologic: No hematuria, dysuria Abdominal:   No nausea, vomiting, diarrhea, bright red blood per rectum, melena, or hematemesis Neurologic:  No visual changes, wkns, changes in mental status. All other systems reviewed  and are otherwise negative except as noted above.  Physical Exam    VS:  BP (!) 140/102   Pulse 82   Ht 5\' 7"  (1.702 m)   Wt 195 lb 14.4 oz (88.9 kg)   SpO2 98%   BMI 30.68 kg/m  , BMI Body mass index is 30.68 kg/m. GEN: Well nourished, well developed, in no acute distress. HEENT: normal. Neck: Supple, no JVD, carotid bruits, or masses. Cardiac: RRR, no murmurs, rubs, or gallops. No clubbing, cyanosis, edema.  Radials/DP/PT 2+ and equal bilaterally.  Respiratory:  Respirations regular and unlabored, clear to auscultation bilaterally. GI: Soft, nontender, nondistended, BS + x 4. MS: no deformity or atrophy. Skin: warm and dry, no rash. Neuro:  Strength and sensation are intact. Psych: Normal affect.  Accessory Clinical Findings    Recent Labs: 12/27/2019: ALT 29; B Natriuretic Peptide 175.3 12/29/2019: Magnesium 2.2; TSH 1.147 12/30/2019: Hemoglobin 13.5; Platelets 259 01/19/2020: BUN 13; Creatinine, Ser 0.87; Potassium 4.3; Sodium 140   Recent Lipid Panel    Component Value Date/Time   CHOL 191 12/29/2019 0417   TRIG 84 12/29/2019 0417   HDL 33 (L) 12/29/2019 0417   CHOLHDL 5.8 12/29/2019 0417   VLDL 17 12/29/2019 0417   LDLCALC 141 (H) 12/29/2019 0417    ECG personally reviewed by me today-none  today.  EKG 12/30/2019 Sinus tachycardia 112 bpm   Echocardiogram 12/28/2019 IMPRESSIONS    1. Severely reduced LVEF 20-25% with diffuse hypokinesis, mildly dilated  left ventricle and left atrium, grade 2 diastolic dysfunction with  elevated filling pressures. RVEF appears normal.  2. Left ventricular ejection fraction, by estimation, is 20 to 25%. The  left ventricle has severely decreased function. The left ventricle  demonstrates global hypokinesis. The left ventricular internal cavity size  was mildly dilated. Left ventricular  diastolic parameters are consistent with Grade II diastolic dysfunction  (pseudonormalization). Elevated left ventricular end-diastolic pressure.  3. Right ventricular systolic function is normal. The right ventricular  size is normal. There is normal pulmonary artery systolic pressure.  4. Left atrial size was mildly dilated.  5. The mitral valve is normal in structure. No evidence of mitral valve  regurgitation. No evidence of mitral stenosis.  6. The aortic valve is normal in structure. Aortic valve regurgitation is  mild. Mild to moderate aortic valve sclerosis/calcification is present,  without any evidence of aortic stenosis.  7. The inferior vena cava is normal in size with greater than 50%  respiratory variability, suggesting right atrial pressure of 3 mmHg.  Coronary CTA 12/29/2019 FINDINGS: A 120 kV prospective scan was triggered in the descending thoracic aorta at 111 HU's. Axial non-contrast 3 mm slices were carried out through the heart. The data set was analyzed on a dedicated work station and scored using the Agatson method. Gantry rotation speed was 250 msecs and collimation was .6 mm. No beta blockade and 0.8 mg of sl NTG was given. The 3D data set was reconstructed in 5% intervals of the 67-82 % of the R-R cycle. Diastolic phases were analyzed on a dedicated work station using MPR, MIP and VRT modes. The patient received 80  cc of contrast.  Aorta: Normal size. Ascending aorta 3.0 cm. No calcifications. No dissection.  Aortic Valve: Trileaflet. No calcifications.  Coronary Arteries: Normal coronary origin. Right dominance.  RCA is a large dominant artery that gives rise to PDA and PLVB. There is no plaque.  Left main is a large artery that gives rise to LAD and LCX  arteries.  LAD is a large vessel that has no plaque. There is a small D1, large branching D2 and small D3 without plaque.  LCX is a non-dominant artery that gives rise to a small OM1 and large OM2 branch. There is no plaque.  Other findings:  Normal pulmonary vein drainage into the left atrium.  Normal let atrial appendage without a thrombus.  Normal size of the pulmonary artery.  IMPRESSION: 1. Coronary calcium score of 0. This was 0 percentile for age and sex matched control.  2. Normal coronary origin with right dominance.  3. No evidence of CAD.  Assessment & Plan   1.  Acute combined systolic and diastolic heart failure-no increased activity intolerance or dyspnea today.  Echocardiogram 12/28/2019 showed LVEF of 20-25%, global hypokinesis, mildly dilated left ventricle, G2 DD, and left atrium mildly dilated.  CT coronary showed calcium score of 0 and no CAD.  Instructed to obtain a blood pressure cuff Continue carvedilol, and aspirin Increase Entresto to 49/51 Heart healthy low-sodium diet-salty 6 given Increase physical activity as tolerated Order BMP in 2 weeks Repeat echocardiogram 1 month after medications have been optimized Continue blood pressure log   Community-acquired pneumonia-breathing normalized.    Chest x-ray 12/28/2019 showed left upper lobe opacity. Continue albuterol, prednisone Increase physical activity as tolerated Continue p.o. hydration   Essential hypertension-BP today  140/102.   Better controlled at home. Continue carvedilol Increase Entresto Heart healthy low-sodium  diet-salty 6 given Increase physical activity as tolerated  Short-term disability paperwork filled out. We will reevaluate ability to return to work in 4 weeks.  Disposition: Follow-up with me in 4 weeks.   Thomasene Ripple. Lavi Sheehan NP-C    02/04/2020, 2:39 PM [facility] [address] Office [phone] Fax [phone]

## 2020-02-03 NOTE — Telephone Encounter (Signed)
Spoke with pt, aware paperwork re-fax and received confirmation that it went through.

## 2020-02-03 NOTE — Telephone Encounter (Signed)
Patient states paperwork has not been received and he is requesting to have it resubmitted. He states it may be faxed to [phone].

## 2020-02-03 NOTE — Telephone Encounter (Signed)
Pt aware was faxed on 01/30/20./cy

## 2020-02-04 ENCOUNTER — Encounter: Payer: Self-pay | Admitting: General Practice

## 2020-02-04 ENCOUNTER — Other Ambulatory Visit: Payer: Self-pay

## 2020-02-04 ENCOUNTER — Ambulatory Visit (INDEPENDENT_AMBULATORY_CARE_PROVIDER_SITE_OTHER): Payer: 59 | Admitting: General Practice

## 2020-02-04 VITALS — BP 140/102 | HR 82 | Ht 67.0 in | Wt 195.9 lb

## 2020-02-04 DIAGNOSIS — J189 Pneumonia, unspecified organism: Secondary | ICD-10-CM | POA: Diagnosis not present

## 2020-02-04 DIAGNOSIS — I10 Essential (primary) hypertension: Secondary | ICD-10-CM | POA: Diagnosis not present

## 2020-02-04 DIAGNOSIS — I5041 Acute combined systolic (congestive) and diastolic (congestive) heart failure: Secondary | ICD-10-CM

## 2020-02-04 DIAGNOSIS — Z79899 Other long term (current) drug therapy: Secondary | ICD-10-CM | POA: Diagnosis not present

## 2020-02-04 MED ORDER — SACUBITRIL-VALSARTAN 49-51 MG PO TABS: 1.0000 | ORAL_TABLET | Freq: Two times a day (BID) | ORAL | 6 refills | Status: DC

## 2020-02-04 NOTE — Patient Instructions (Signed)
Medication Instructions:  INCREASE ENTRESTO 4-/51MG  TWICE DAILY *If you need a refill on your cardiac medications before your next appointment, please call your pharmacy*  Lab Work: BMET IN 2 WEEKS (02-18-2020) If you have labs (blood work) drawn today and your tests are completely normal, you will receive your results only by:  MyChart Message (if you have MyChart) OR A paper copy in the mail.  If you have any lab test that is abnormal or we need to change your treatment, we will call you to review the results. You may go to any [facility] that is convenient for you however, we do have a lab in our office that is able to assist you. You DO NOT need an appointment for our lab. The lab is open 8:00am and closes at 4:00pm. Lunch 12:45 - 1:45pm. pecial Instructions PLEASE READ AND FOLLOW SALTY 6-ATTACHED  PLEASE INCREASE PHYSICAL ACTIVITY AS TOLERATED  Follow-Up: Your next appointment:  4 week(s)  In Person with Chilton Si, MD -OR- JESSE CLEAVER, FNP-C  At [facility], you and your health needs are our priority.  As part of our continuing mission to provide you with exceptional heart care, we have created designated Provider Care Teams.  These Care Teams include your primary Cardiologist (physician) and Advanced Practice Providers (APPs -  Physician Assistants and Nurse Practitioners) who all work together to provide you with the care you need, when you need it.

## 2020-02-05 ENCOUNTER — Telehealth: Payer: Self-pay | Admitting: General Practice

## 2020-02-05 NOTE — Telephone Encounter (Signed)
  1. Are you calling in reference to your FMLA or disability form? Yes   2. What is your question in regards to FMLA or disability form? Requesting Alex Lee faxes FMLA forms that keeping him out of work for the next 4 weeks to his job.   3. Do you need copies of your medical records? No   4. Are you waiting on a nurse to call you back with results or are you wanting copies of your results? No  Alex Lee states he is needing a note faxed in regards to Alex Lee wanting him to stay out of work for the next 4 weeks. The fax number is [phone] please state on fax "ATTN: Alex Lee"    Please route to Medical Records or your medical records site representative

## 2020-02-05 NOTE — Telephone Encounter (Signed)
JESSE is reviewing forms and will be calling pt to discuss

## 2020-02-05 NOTE — Telephone Encounter (Signed)
D/w Verdon Cummins Cleaver,FNP-C: we are unable to fill out FMLA forms at this time because pt needs to be out of work from 1-4 months for medication stabilization.  Pt notified, he verbalizes understanding. He will await upcoming appt to further discuss. He states that he still needs a note to be out of work until scheduled appt. "Carmen's" phone number is [phone].  Tried to call Candie Chroman, [employer]. On hold for 12+ minutes will have to try again later.  Faxed out of work letter to [employer] and "[employer]"

## 2020-02-06 NOTE — Telephone Encounter (Signed)
Tried to call "jeanette" again to inform that I faxed her letter yesterday. Was on hol for 9+ minutes had to hang up. Will have to try again later

## 2020-02-11 NOTE — Telephone Encounter (Signed)
Patient called in to advise that a company by the name of [employer] faxed over short term disability forms and is requesting that Edd Fabian fill them out and send back. Please advise.

## 2020-02-12 ENCOUNTER — Telehealth: Payer: Self-pay | Admitting: General Practice

## 2020-02-12 ENCOUNTER — Encounter: Payer: Self-pay | Admitting: General Practice

## 2020-02-12 NOTE — Telephone Encounter (Signed)
Short Term Disability paper work received to be completed by Edd Fabian, NP.  Form was put in  His mailbox  02/12/20  [employer]

## 2020-02-16 ENCOUNTER — Ambulatory Visit: Payer: Self-pay | Attending: Critical Care Medicine

## 2020-02-16 DIAGNOSIS — Z23 Encounter for immunization: Secondary | ICD-10-CM

## 2020-02-16 NOTE — Telephone Encounter (Signed)
Forms received.  

## 2020-02-16 NOTE — Telephone Encounter (Signed)
Notified pt that I have been unable to contact [facility], he will call them and see if the records that I have sent are what they need. He will have them call if anything else is needed. Pt states that he also needs a prior Auth done for his entresto. PHARMACY NOTIFIED THAT PT DOES NOT HAVE INSURANCE AT THIS TIME. PT WILL HAVE TO APPLY FOR PT ASSISTANCE AS WE HAVE VERY FEW SAMPLES. I HAVE PLACED 1 BOTTLE (THAT IS ALL WE HAVE) AT THE FRONT DESK FOR PICK UP.  LM2CB TO DISCUSS THIS WITH PT

## 2020-02-16 NOTE — Telephone Encounter (Signed)
Called [employer] ref#[id number] transferred and got disconnected after 20 minutes Called back Robinson) for [place], [id number] PENDING 3-4 DAYS.

## 2020-02-16 NOTE — Progress Notes (Signed)
   Covid-19 Vaccination Clinic  Name:  Mohan Erven    MRN: [id number] DOB: Dec 22, 1975  02/16/2020  Mr. Siefert was observed post Covid-19 immunization for 15 minutes without incident. He was provided with Vaccine Information Sheet and instruction to access the V-Safe system.   Mr. Klee was instructed to call 911 with any severe reactions post vaccine: Marland Kitchen Difficulty breathing  . Swelling of face and throat  . A fast heartbeat  . A bad rash all over body  . Dizziness and weakness   Immunizations Administered    Name Date Dose VIS Date Route   Pfizer COVID-19 Vaccine 02/16/2020  4:32 PM 0.3 mL 09/03/2018 Intramuscular   Manufacturer: [employer], [employer]   Lot: [id number]   NDC: [address]-7

## 2020-02-16 NOTE — Telephone Encounter (Signed)
Tried to call [employer] on hold too long had to hang up will have to try again at a later time

## 2020-02-17 NOTE — Telephone Encounter (Signed)
[employer] will be sending an Attending Physician Statement for Alex Lee to fill out for the patient's short term disability claim. There will be a claim number and a policy number on the form. [employer] will need Verdon Cummins to fill it out and fax it back to them.

## 2020-02-17 NOTE — Telephone Encounter (Signed)
Jimmey Ralph February 17, 2020 2:19 PM   [employer] option 5 [phone] [employer] will be sending an Attending Physician Statement for Alex Lee to fill out for the patient's short term disability claim. There will be a claim number and a policy number on the form. [employer] will need Verdon Cummins to fill it out and fax it back to them.

## 2020-02-17 NOTE — Telephone Encounter (Signed)
Sent to primary nurse 

## 2020-02-17 NOTE — Telephone Encounter (Signed)
SEE OTHER MESSAGE. DUPLICATE

## 2020-02-17 NOTE — Telephone Encounter (Signed)
° ° °  Brett Canales from [employer] called and would like to confirm if Alex Lee receive paperwork faxed today

## 2020-02-19 ENCOUNTER — Telehealth: Payer: Self-pay | Admitting: General Practice

## 2020-02-19 NOTE — Telephone Encounter (Signed)
LVM2CB 8/12 °

## 2020-02-19 NOTE — Telephone Encounter (Signed)
Patient's wife states patient's insurance is requesting prior authorization for sacubitril-valsartan (ENTRESTO) 49-51 MG.

## 2020-02-19 NOTE — Telephone Encounter (Signed)
Jemery Arwood Key: [id number] - PA Case ID: [id number] Status Sent to [employer] Entresto 49-51MG  tablets Form [employer] Electronic Prior Authorization Form (2017 NCPDP)   PA sent to plan. Waiting on response  LVM2CB 8/12

## 2020-02-19 NOTE — Telephone Encounter (Signed)
Follow up ° ° ° ° ° °Returning a call to the nurse °

## 2020-02-20 LAB — BASIC METABOLIC PANEL
BUN/Creatinine Ratio: 12 (ref 9–20)
BUN: 12 mg/dL (ref 6–24)
CO2: 23 mmol/L (ref 20–29)
Calcium: 9.3 mg/dL (ref 8.7–10.2)
Chloride: 105 mmol/L (ref 96–106)
Creatinine, Ser: 0.97 mg/dL (ref 0.76–1.27)
GFR calc Af Amer: 109 mL/min/{1.73_m2} (ref >59)
GFR calc non Af Amer: 94 mL/min/{1.73_m2} (ref >59)
Glucose: 101 mg/dL — ABNORMAL HIGH (ref 65–99)
Potassium: 4.1 mmol/L (ref 3.5–5.2)
Sodium: 139 mmol/L (ref 134–144)

## 2020-02-20 NOTE — Telephone Encounter (Signed)
    Pt calling to follow up his short term disability form. She said [employer] been contacting him and sent another for, to [employer]

## 2020-02-20 NOTE — Telephone Encounter (Signed)
Left message  To Alex Lee . disability form is present in the office . Awaiting for forms to be completed. Nurse pract. not in office until 02/26/20

## 2020-02-20 NOTE — Telephone Encounter (Signed)
Spoke to patient advised prior auth for Alex Lee has been sent to his insurance.Advised we are waiting on approval.I will send message to [facility] nurse.

## 2020-02-20 NOTE — Telephone Encounter (Signed)
Follow up  ° ° °Pt returning call  °

## 2020-02-23 NOTE — Telephone Encounter (Signed)
LMVM-OK for NP to sign and not MD?

## 2020-02-23 NOTE — Telephone Encounter (Signed)
Form faxed to number provided to-Steve Bonaito @ [employer]

## 2020-02-23 NOTE — Telephone Encounter (Signed)
Patient calling back to follow up again.

## 2020-02-25 NOTE — Telephone Encounter (Signed)
Still have not heard from [facility]

## 2020-02-26 ENCOUNTER — Telehealth: Payer: Self-pay

## 2020-02-26 NOTE — Telephone Encounter (Signed)
Masiah Bantz Key: [id number] - Outcome Approved on August 12 Request Reference Number: [id number]. ENTRESTO TAB 49-51MG  is approved through 02/18/2021. Your patient may now fill this prescription and it will be covered. Drug Entresto 49-51MG  tablets Form [employer]Rx Electronic Prior Authorization Form (2017 NCPDP)  PA approved.

## 2020-02-26 NOTE — Telephone Encounter (Signed)
Left message to call back  

## 2020-03-03 ENCOUNTER — Encounter: Payer: Self-pay | Admitting: Cardiovascular Disease

## 2020-03-03 ENCOUNTER — Ambulatory Visit (INDEPENDENT_AMBULATORY_CARE_PROVIDER_SITE_OTHER): Payer: 59 | Admitting: Cardiovascular Disease

## 2020-03-03 ENCOUNTER — Encounter: Payer: Self-pay | Admitting: *Deleted

## 2020-03-03 ENCOUNTER — Other Ambulatory Visit: Payer: Self-pay

## 2020-03-03 VITALS — BP 140/98 | HR 97 | Ht 67.0 in | Wt 201.4 lb

## 2020-03-03 DIAGNOSIS — R400 Somnolence: Secondary | ICD-10-CM | POA: Diagnosis not present

## 2020-03-03 DIAGNOSIS — Z5181 Encounter for therapeutic drug level monitoring: Secondary | ICD-10-CM

## 2020-03-03 DIAGNOSIS — I5043 Acute on chronic combined systolic (congestive) and diastolic (congestive) heart failure: Secondary | ICD-10-CM

## 2020-03-03 DIAGNOSIS — I10 Essential (primary) hypertension: Secondary | ICD-10-CM

## 2020-03-03 DIAGNOSIS — R0683 Snoring: Secondary | ICD-10-CM | POA: Insufficient documentation

## 2020-03-03 HISTORY — DX: Essential (primary) hypertension: I10

## 2020-03-03 HISTORY — DX: Snoring: R06.83

## 2020-03-03 MED ORDER — CARVEDILOL 6.25 MG PO TABS
6.2500 mg | ORAL_TABLET | Freq: Two times a day (BID) | ORAL | 3 refills | Status: DC
Start: 2020-03-03 — End: 2020-04-08

## 2020-03-03 MED ORDER — SPIRONOLACTONE 25 MG PO TABS
25.0000 mg | ORAL_TABLET | Freq: Every day | ORAL | 3 refills | Status: DC
Start: 2020-03-03 — End: 2020-09-08

## 2020-03-03 NOTE — Progress Notes (Signed)
Cardiology Office Note  Date:  03/03/2020   ID:  Alex Lee, DOB 1976-07-05, MRN [id number]  PCP:  Patient, No Pcp Per  Cardiologist:   Chilton Si, MD   No chief complaint on file.   History of Present Illness: Alex Lee is a 44 y.o. male chronic systolic diastolic heart failure, asthma and hyperlipidemia who presents for follow-up.  He was initially seen 12/2019 in the hospital with acute onset systolic dysfunction.  He presented with two days of shortness of breath.LVEF was 20 to 25%.  He underwent coronary CT-a which had a calcium score of 0 and no evidence of coronary artery disease.  He reported drinking 2-3 drinks daily at the time.  He was discharged on carvedilol and Entresto.  Entresto was increased 01/2020.  He has been feeling well.  His main complaint today is that he has a toothache that has been bothering him.  He walks for exercise twice per day for about 15 minutes.  He has mild shortness of breath and no chest pain.  He denies orthopnea or PND.  He has not been checking his blood pressure at home much lately.  However he notes that it has improved since he increased Entresto.  He denies any lower extremity edema, orthopnea, or PND.  He does snore and he does not feel rested in the morning.  He has daytime somnolence and easily falls asleep, especially after driving.  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  He was recently able to get short-term disability.   Past Medical History:  Diagnosis Date  . Childhood asthma   . Tobacco abuse     No past surgical history on file.   Current Outpatient Medications  Medication Sig Dispense Refill  . albuterol (VENTOLIN HFA) 108 (90 Base) MCG/ACT inhaler Inhale 2 puffs into the lungs every 6 (six) hours as needed for wheezing or shortness of breath. 18 g 0  . carvedilol (COREG) 6.25 MG tablet Take 1 tablet (6.25 mg total) by mouth 2 (two) times daily with a meal. 180 tablet 3   . sacubitril-valsartan (ENTRESTO) 49-51 MG Take 1 tablet by mouth 2 (two) times daily. 60 tablet 6  . spironolactone (ALDACTONE) 25 MG tablet Take 1 tablet (25 mg total) by mouth daily. 90 tablet 3   No current facility-administered medications for this visit.    Allergies:   Patient has no known allergies.    Social History:  The patient  reports that he has been smoking. He has been smoking about 0.50 packs per day. He has never used smokeless tobacco. He reports current alcohol use of about 10.0 standard drinks of alcohol per week. He reports previous drug use.   Family History:  The patient's family history includes Hypertension in his father and mother.    ROS:  Please see the history of present illness.   Otherwise, review of systems are positive for none.   All other systems are reviewed and negative.    PHYSICAL EXAM: VS:  BP (!) 140/98   Pulse 97   Ht 5\' 7"  (1.702 m)   Wt 201 lb 6.4 oz (91.4 kg)   SpO2 98%   BMI 31.54 kg/m  , BMI Body mass index is 31.54 kg/m. GENERAL:  Well appearing HEENT:  Pupils equal round and reactive, fundi not visualized, oral mucosa unremarkable NECK:  No jugular venous distention, waveform within normal limits, carotid upstroke brisk and symmetric,  no bruits LUNGS:  Clear to auscultation bilaterally HEART:  RRR.  PMI not displaced or sustained,S1 and S2 within normal limits, no S3, no S4, no clicks, no rubs, no murmurs ABD:  Flat, positive bowel sounds normal in frequency in pitch, no bruits, no rebound, no guarding, no midline pulsatile mass, no hepatomegaly, no splenomegaly EXT:  2 plus pulses throughout, no edema, no cyanosis no clubbing SKIN:  No rashes no nodules NEURO:  Cranial nerves II through XII grossly intact, motor grossly intact throughout PSYCH:  Cognitively intact, oriented to person place and time   EKG:  EKG is not ordered today.  Echocardiogram6/20/2021: 1. Severely reduced LVEF 20-25% with diffuse hypokinesis, mildly  dilated  left ventricle and left atrium, grade 2 diastolic dysfunction with  elevated filling pressures. RVEF appears normal.  2. Left ventricular ejection fraction, by estimation, is 20 to 25%. The  left ventricle has severely decreased function. The left ventricle  demonstrates global hypokinesis. The left ventricular internal cavity size  was mildly dilated. Left ventricular  diastolic parameters are consistent with Grade II diastolic dysfunction  (pseudonormalization). Elevated left ventricular end-diastolic pressure.  3. Right ventricular systolic function is normal. The right ventricular  size is normal. There is normal pulmonary artery systolic pressure.  4. Left atrial size was mildly dilated.  5. The mitral valve is normal in structure. No evidence of mitral valve  regurgitation. No evidence of mitral stenosis.  6. The aortic valve is normal in structure. Aortic valve regurgitation is  mild. Mild to moderate aortic valve sclerosis/calcification is present,  without any evidence of aortic stenosis.  7. The inferior vena cava is normal in size with greater than 50%  respiratory variability, suggesting right atrial pressure of 3 mmHg.  Coronary CTA 12/29/2019 1. Coronary calcium score of 0. This was 0 percentile for age and sex matched control. 2. Normal coronary origin with right dominance. 3. No evidence of CAD.   Recent Labs: 12/27/2019: ALT 29; B Natriuretic Peptide 175.3 12/29/2019: Magnesium 2.2; TSH 1.147 12/30/2019: Hemoglobin 13.5; Platelets 259 02/19/2020: BUN 12; Creatinine, Ser 0.97; Potassium 4.1; Sodium 139    Lipid Panel    Component Value Date/Time   CHOL 191 12/29/2019 0417   TRIG 84 12/29/2019 0417   HDL 33 (L) 12/29/2019 0417   CHOLHDL 5.8 12/29/2019 0417   VLDL 17 12/29/2019 0417   LDLCALC 141 (H) 12/29/2019 0417      Wt Readings from Last 3 Encounters:  03/03/20 201 lb 6.4 oz (91.4 kg)  02/04/20 195 lb 14.4 oz (88.9 kg)  01/06/20 200 lb  (90.7 kg)      ASSESSMENT AND PLAN:  # Chronic systolic and diastolic heart failure:  LVEF 20 to 25%.  He is euvolemic on exam today.  He has minimal heart failure symptoms.  Continue Entresto and carvedilol.  We will add spironolactone 25 mg daily.  He will base metabolic panel checked in 1 week.  He will also have an echocardiogram repeated next month, which will be 3 months after his initial assessment.  He works outside in the hot weather.  This is not advisable for someone with advanced heart failure.  We will reassess after his upcoming echo.  The etiology of his heart failure is unknown.  Coronaries were negative on CT-A.  He and his wife note that his alcohol intake is not excessive.  He mostly drinks a couple beers on the weekends.  # Essential hypertension: Blood pressure is poorly controlled as above.  Adding  spironolactone as above.  BMP in a week.  # Snoring: # Daytime somnolence: Check sleep study.  Current medicines are reviewed at length with the patient today.  The patient does not have concerns regarding medicines.  The following changes have been made:  Add spironolactone  Labs/ tests ordered today include:   Orders Placed This Encounter  Procedures  . Basic metabolic panel  . ECHOCARDIOGRAM COMPLETE  . Split night study     Disposition:   FU with Melayah Skorupski C. Duke Salvia, MD, [facility] in 1 month     Signed, Bryanne Riquelme C. Duke Salvia, MD, [facility]  03/03/2020 5:38 PM    [facility]

## 2020-03-03 NOTE — Telephone Encounter (Signed)
Patient seen in office today. 

## 2020-03-03 NOTE — Patient Instructions (Addendum)
Medication Instructions:  START SPIRONOLACTONE 25 MG DAILY   *If you need a refill on your cardiac medications before your next appointment, please call your pharmacy*  Lab Work: BEMT IN 1 WEEK   YOU WILL NEED A COVID SCREENING TEST 3 DAYS PRIOR TO YOUR SLEEP STUDY   If you have labs (blood work) drawn today and your tests are completely normal, you will receive your results only by: Marland Kitchen MyChart Message (if you have MyChart) OR . A paper copy in the mail If you have any lab test that is abnormal or we need to change your treatment, we will call you to review the results.  Testing/Procedures: Your physician has requested that you have an echocardiogram. Echocardiography is a painless test that uses sound waves to create images of your heart. It provides your doctor with information about the size and shape of your heart and how well your heart's chambers and valves are working. This procedure takes approximately one hour. There are no restrictions for this procedure. AFTER 03/29/2020  [facility] AT [address]   Your physician has recommended that you have a sleep study. This test records several body functions during sleep, including: brain activity, eye movement, oxygen and carbon dioxide blood levels, heart rate and rhythm, breathing rate and rhythm, the flow of air through your mouth and nose, snoring, body muscle movements, and chest and belly movement. THE OFFICE WILL CALL YOU TO SCHEDULE ONCE YOUR INSURANCE HAS APPROVED  Follow-Up: At [facility], you and your health needs are our priority.  As part of our continuing mission to provide you with exceptional heart care, we have created designated Provider Care Teams.  These Care Teams include your primary Cardiologist (physician) and Advanced Practice Providers (APPs -  Physician Assistants and Nurse Practitioners) who all work together to provide you with the care you need, when you need it.  We recommend signing up  for the patient portal called "MyChart".  Sign up information is provided on this After Visit Summary.  MyChart is used to connect with patients for Virtual Visits (Telemedicine).  Patients are able to view lab/test results, encounter notes, upcoming appointments, etc.  Non-urgent messages can be sent to your provider as well.   To learn more about what you can do with MyChart, go to [email or web address].    Your next appointment:   AFTER ECHO IN PERSON   Provider:   You may see Chilton Si, MD or one of the following Advanced Practice Providers on your designated Care Team:    Corine Shelter, PA-C  [place], [place]  Edd Fabian, [place]

## 2020-03-08 ENCOUNTER — Ambulatory Visit: Payer: Self-pay

## 2020-03-13 LAB — BASIC METABOLIC PANEL
BUN/Creatinine Ratio: 17 (ref 9–20)
BUN: 17 mg/dL (ref 6–24)
CO2: 23 mmol/L (ref 20–29)
Calcium: 9.6 mg/dL (ref 8.7–10.2)
Chloride: 104 mmol/L (ref 96–106)
Creatinine, Ser: 0.99 mg/dL (ref 0.76–1.27)
GFR calc Af Amer: 106 mL/min/{1.73_m2} (ref >59)
GFR calc non Af Amer: 92 mL/min/{1.73_m2} (ref >59)
Glucose: 98 mg/dL (ref 65–99)
Potassium: 4.4 mmol/L (ref 3.5–5.2)
Sodium: 139 mmol/L (ref 134–144)

## 2020-03-23 ENCOUNTER — Other Ambulatory Visit (HOSPITAL_COMMUNITY): Payer: 59

## 2020-03-31 ENCOUNTER — Other Ambulatory Visit: Payer: Self-pay

## 2020-03-31 ENCOUNTER — Ambulatory Visit (HOSPITAL_COMMUNITY): Payer: 59 | Attending: Cardiology

## 2020-03-31 DIAGNOSIS — I5043 Acute on chronic combined systolic (congestive) and diastolic (congestive) heart failure: Secondary | ICD-10-CM | POA: Insufficient documentation

## 2020-03-31 LAB — ECHOCARDIOGRAM COMPLETE
Area-P 1/2: 3.51 cm2
S' Lateral: 4 cm

## 2020-04-08 ENCOUNTER — Ambulatory Visit (INDEPENDENT_AMBULATORY_CARE_PROVIDER_SITE_OTHER): Payer: 59 | Admitting: Cardiovascular Disease

## 2020-04-08 ENCOUNTER — Other Ambulatory Visit: Payer: Self-pay

## 2020-04-08 ENCOUNTER — Encounter: Payer: Self-pay | Admitting: Cardiovascular Disease

## 2020-04-08 ENCOUNTER — Encounter: Payer: Self-pay | Admitting: *Deleted

## 2020-04-08 ENCOUNTER — Ambulatory Visit: Payer: 59 | Admitting: Cardiovascular Disease

## 2020-04-08 VITALS — BP 142/80 | HR 58 | Ht 67.0 in | Wt 199.0 lb

## 2020-04-08 DIAGNOSIS — I5041 Acute combined systolic (congestive) and diastolic (congestive) heart failure: Secondary | ICD-10-CM | POA: Diagnosis not present

## 2020-04-08 DIAGNOSIS — Z5181 Encounter for therapeutic drug level monitoring: Secondary | ICD-10-CM | POA: Diagnosis not present

## 2020-04-08 DIAGNOSIS — I5042 Chronic combined systolic (congestive) and diastolic (congestive) heart failure: Secondary | ICD-10-CM

## 2020-04-08 DIAGNOSIS — Z720 Tobacco use: Secondary | ICD-10-CM

## 2020-04-08 DIAGNOSIS — I10 Essential (primary) hypertension: Secondary | ICD-10-CM | POA: Diagnosis not present

## 2020-04-08 HISTORY — DX: Tobacco use: Z72.0

## 2020-04-08 MED ORDER — EMPAGLIFLOZIN 10 MG PO TABS: 10.0000 mg | ORAL_TABLET | Freq: Every day | ORAL | 5 refills | Status: DC

## 2020-04-08 MED ORDER — SACUBITRIL-VALSARTAN 97-103 MG PO TABS: 1.0000 | ORAL_TABLET | Freq: Two times a day (BID) | ORAL | 5 refills | Status: DC

## 2020-04-08 MED ORDER — CARVEDILOL 12.5 MG PO TABS
12.5000 mg | ORAL_TABLET | Freq: Two times a day (BID) | ORAL | 5 refills | Status: DC
Start: 2020-04-08 — End: 2020-08-05

## 2020-04-08 NOTE — Progress Notes (Signed)
Cardiology Office Note  Date:  04/08/2020   ID:  Alex Lee, DOB Oct 18, 1975, MRN [id number]  PCP:  Patient, No Pcp Per  Cardiologist:   Chilton Si, MD   No chief complaint on file.   History of Present Illness: Alex Lee is a 44 y.o. male chronic systolic diastolic heart failure, hypertension, asthma, tobacco abuse, and hyperlipidemia who presents for follow-up.  He was initially seen 12/2019 in the hospital with acute onset systolic dysfunction.  He presented with two days of shortness of breath.LVEF was 20 to 25%.  He underwent coronary CT-a which had a calcium score of 0 and no evidence of coronary artery disease.  He reported drinking 2-3 drinks daily at the time.  He was discharged on carvedilol and Entresto.  Entresto was increased 01/2020.  He has been feeling well.  His main complaint today is that he has a toothache that has been bothering him.  He walks for exercise twice per day for about 15 minutes.  He has mild shortness of breath and no chest pain.  He denies orthopnea or PND.  He has not been checking his blood pressure at home much lately.  However he notes that it has improved since he increased Entresto.  He denies any lower extremity edema, orthopnea, or PND.  He does snore and he does not feel rested in the morning.  He has daytime somnolence and easily falls asleep, especially after driving.  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  He was recently able to get short-term disability.  At his last appointment spironolactone was added to his regimen due to poorly controlled blood pressures.  He had a follow-up echocardiogram 03/2020 that revealed LVEF 25 to 30% with global hypokinesis and grade 2 diastolic dysfunction.  Right ventricular function was normal.  He presents today to discuss defibrillator implant.  He reports episodes of blurry vision that started a few weeks ago.  It seems to be mostly in his L eye but sometimes  both.  It lasts for about 5 minutes.  There is no associated pain and it occurred at rest.  He reports taking his meds at home.  His blood pressure has been running in the 140s to 160s over 80s to 90s.  He has no edema, orthopnea, or PND.  He has been trying to walk for exercise 15 to 20 minutes.  He feels generally well.  However when he tried to walk for 40 minutes he got tired.  His wife notes that he was in hot weather at the time.  He has some occasional episodes of sharp left-sided chest pain that are worse with inspiration.  He denies any palpitations, lightheadedness, or dizziness.  His weight has been stable and he has been trying to limit his sodium intake.  He is weaning his cigarette use and is down to a much less than half a pack a day.     Past Medical History:  Diagnosis Date   Childhood asthma    Essential hypertension 03/03/2020   Snoring 03/03/2020   Tobacco abuse    Tobacco abuse 04/08/2020    No past surgical history on file.   Current Outpatient Medications  Medication Sig Dispense Refill   albuterol (VENTOLIN HFA) 108 (90 Base) MCG/ACT inhaler Inhale 2 puffs into the lungs every 6 (six) hours as needed for wheezing or shortness of breath. 18 g 0   carvedilol (COREG) 12.5 MG tablet  Take 1 tablet (12.5 mg total) by mouth 2 (two) times daily with a meal. 60 tablet 5   spironolactone (ALDACTONE) 25 MG tablet Take 1 tablet (25 mg total) by mouth daily. 90 tablet 3   empagliflozin (JARDIANCE) 10 MG TABS tablet Take 1 tablet (10 mg total) by mouth daily before breakfast. 30 tablet 5   sacubitril-valsartan (ENTRESTO) 97-103 MG Take 1 tablet by mouth 2 (two) times daily. 60 tablet 5   No current facility-administered medications for this visit.    Allergies:   Patient has no known allergies.    Social History:  The patient  reports that he has been smoking. He has been smoking about 0.50 packs per day. He has never used smokeless tobacco. He reports current alcohol  use of about 10.0 standard drinks of alcohol per week. He reports previous drug use.   Family History:  The patient's family history includes Hypertension in his father and mother.    ROS:  Please see the history of present illness.   Otherwise, review of systems are positive for none.   All other systems are reviewed and negative.    PHYSICAL EXAM: VS:  BP (!) 142/80    Pulse (!) 58    Ht 5\' 7"  (1.702 m)    Wt 199 lb (90.3 kg)    SpO2 98%    BMI 31.17 kg/m  , BMI Body mass index is 31.17 kg/m. GENERAL:  Well appearing HEENT:  Pupils equal round and reactive, fundi not visualized, oral mucosa unremarkable NECK:  No jugular venous distention, waveform within normal limits, carotid upstroke brisk and symmetric, no bruits LUNGS:  Clear to auscultation bilaterally HEART:  RRR.  PMI not displaced or sustained,S1 and S2 within normal limits, no S3, no S4, no clicks, no rubs, no murmurs ABD:  Flat, positive bowel sounds normal in frequency in pitch, no bruits, no rebound, no guarding, no midline pulsatile mass, no hepatomegaly, no splenomegaly EXT:  2 plus pulses throughout, no edema, no cyanosis no clubbing SKIN:  No rashes no nodules NEURO:  Cranial nerves II through XII grossly intact, motor grossly intact throughout PSYCH:  Cognitively intact, oriented to person place and time   EKG:  EKG is not ordered today.  03/2020: 1. Left ventricular ejection fraction, by estimation, is 25 to 30%. The  left ventricle has severely decreased function. The left ventricle  demonstrates global hypokinesis. Left ventricular diastolic parameters are  consistent with Grade II diastolic  dysfunction (pseudonormalization).  2. Right ventricular systolic function is normal. The right ventricular  size is normal. Tricuspid regurgitation signal is inadequate for assessing  PA pressure.  3. Left atrial size was mildly dilated.  4. The mitral valve is normal in structure. No evidence of mitral valve   regurgitation. No evidence of mitral stenosis.  5. The aortic valve is tricuspid. Aortic valve regurgitation is trivial.  No aortic stenosis is present.  6. The inferior vena cava is normal in size with greater than 50%  respiratory variability, suggesting right atrial pressure of 3 mmHg.   Echocardiogram6/20/2021: 1. Severely reduced LVEF 20-25% with diffuse hypokinesis, mildly dilated  left ventricle and left atrium, grade 2 diastolic dysfunction with  elevated filling pressures. RVEF appears normal.  2. Left ventricular ejection fraction, by estimation, is 20 to 25%. The  left ventricle has severely decreased function. The left ventricle  demonstrates global hypokinesis. The left ventricular internal cavity size  was mildly dilated. Left ventricular  diastolic parameters are consistent with  Grade II diastolic dysfunction  (pseudonormalization). Elevated left ventricular end-diastolic pressure.  3. Right ventricular systolic function is normal. The right ventricular  size is normal. There is normal pulmonary artery systolic pressure.  4. Left atrial size was mildly dilated.  5. The mitral valve is normal in structure. No evidence of mitral valve  regurgitation. No evidence of mitral stenosis.  6. The aortic valve is normal in structure. Aortic valve regurgitation is  mild. Mild to moderate aortic valve sclerosis/calcification is present,  without any evidence of aortic stenosis.  7. The inferior vena cava is normal in size with greater than 50%  respiratory variability, suggesting right atrial pressure of 3 mmHg.  Coronary CTA 12/29/2019 1. Coronary calcium score of 0. This was 0 percentile for age and sex matched control. 2. Normal coronary origin with right dominance. 3. No evidence of CAD.   Recent Labs: 12/27/2019: ALT 29; B Natriuretic Peptide 175.3 12/29/2019: Magnesium 2.2; TSH 1.147 12/30/2019: Hemoglobin 13.5; Platelets 259 03/12/2020: BUN 17; Creatinine, Ser  0.99; Potassium 4.4; Sodium 139    Lipid Panel    Component Value Date/Time   CHOL 191 12/29/2019 0417   TRIG 84 12/29/2019 0417   HDL 33 (L) 12/29/2019 0417   CHOLHDL 5.8 12/29/2019 0417   VLDL 17 12/29/2019 0417   LDLCALC 141 (H) 12/29/2019 0417      Wt Readings from Last 3 Encounters:  04/08/20 199 lb (90.3 kg)  03/03/20 201 lb 6.4 oz (91.4 kg)  02/04/20 195 lb 14.4 oz (88.9 kg)      ASSESSMENT AND PLAN:  # Chronic systolic and diastolic heart failure:  LVEF 20 to 25% and improved mildly to 25 to 30%.  He has been on guideline directed medical therapy but his LVEF remains less than 35%.  We discussed his possible need for ICD.  However I am still bothered by the fact that we do not have a great answer for his cardiomyopathy.  There is initially concern for alcohol but he and his wife both report that he really does not drink that much.  It certainly could be hypertensive cardiomyopathy, though there was not a lot of LVH on his echo.  His blood pressure remains poorly controlled.  I think it is worth getting his blood pressure under better control and then repeating his echo in a few months.  We will increase the Entresto to 97/103 twice daily.  We will also increase carvedilol to 12.5 mg twice daily.  Continue spironolactone.  We will have him start Jardiance 10 mg a day.  I will also refer him to our advanced heart failure clinic.  I will defer to them whether or not he would benefit from a cardiac MRI or other evaluation.   # Essential hypertension: Blood pressure is poorly controlled as above.  Increasing carvedilol and Entresto.  We will also get renal artery Dopplers.  Sleep study is pending.  TSH was normal 12/2019.  # Snoring: # Daytime somnolence: Check sleep study.  # Tobacco abuse:  Encouraged him to keep cutting back.   Current medicines are reviewed at length with the patient today.  The patient does not have concerns regarding medicines.  The following changes  have been made:  Increase Entresto and carvedilol.  Start [place].   Labs/ tests ordered today include:   Orders Placed This Encounter  Procedures   Basic metabolic panel   AMB referral to CHF clinic   VAS US RENAL ARTERY DUPLEX     Disposition:  FU with Letisia Schwalb C. Duke Salvia, MD, [facility] in 4 months.  Referral to advanced HF.     Signed, Vayden Weinand C. Duke Salvia, MD, [facility]  04/08/2020 2:04 PM    [facility]

## 2020-04-08 NOTE — Patient Instructions (Addendum)
Medication Instructions:  START JARDIANCE 10 MG DAILY   INCREASE YOUR ENTRESTO TO 97-103 MG TWICE A DAY   INCREASE YOUR CARVEDILOL TO 12.5 MG TWICE A DAY   *If you need a refill on your cardiac medications before your next appointment, please call your pharmacy*  Lab Work: BMET IN 1 WEEK   If you have labs (blood work) drawn today and your tests are completely normal, you will receive your results only by: Marland Kitchen MyChart Message (if you have MyChart) OR . A paper copy in the mail If you have any lab test that is abnormal or we need to change your treatment, we will call you to review the results.  Testing/Procedures: Your physician has requested that you have a renal artery duplex. During this test, an ultrasound is used to evaluate blood flow to the kidneys. Allow one hour for this exam. Do not eat after midnight the day before and avoid carbonated beverages. Take your medications as you usually do.  Your physician has recommended that you have a sleep study. This test records several body functions during sleep, including: brain activity, eye movement, oxygen and carbon dioxide blood levels, heart rate and rhythm, breathing rate and rhythm, the flow of air through your mouth and nose, snoring, body muscle movements, and chest and belly movement.  Follow-Up: At [facility], you and your health needs are our priority.  As part of our continuing mission to provide you with exceptional heart care, we have created designated Provider Care Teams.  These Care Teams include your primary Cardiologist (physician) and Advanced Practice Providers (APPs -  Physician Assistants and Nurse Practitioners) who all work together to provide you with the care you need, when you need it.  We recommend signing up for the patient portal called "MyChart".  Sign up information is provided on this After Visit Summary.  MyChart is used to connect with patients for Virtual Visits (Telemedicine).  Patients are able to  view lab/test results, encounter notes, upcoming appointments, etc.  Non-urgent messages can be sent to your provider as well.   To learn more about what you can do with MyChart, go to [email or web address].    Your next appointment:   3 month(s)  The format for your next appointment:   In Person  Provider:   You may see Chilton Si, MD or one of the following Advanced Practice Providers on your designated Care Team:    Corine Shelter, PA-C  [place], [place]  Edd Fabian, [place]  You have been referred to HEART FAILURE CLINIC WITHIN 1 MONTH  Other Instructions  CALL THE OFFICE IN 1 MONTH AND UPDATE DR Spalding ABOUT WORK, HOPEFULLY TO RETURN THEN

## 2020-04-12 ENCOUNTER — Telehealth: Payer: Self-pay | Admitting: *Deleted

## 2020-04-12 NOTE — Telephone Encounter (Signed)
PA submitted via web portal to [facility] for in lab sleep study.

## 2020-04-13 ENCOUNTER — Other Ambulatory Visit: Payer: Self-pay

## 2020-04-13 ENCOUNTER — Telehealth: Payer: Self-pay | Admitting: Cardiovascular Disease

## 2020-04-13 ENCOUNTER — Ambulatory Visit (HOSPITAL_COMMUNITY)
Admission: RE | Admit: 2020-04-13 | Discharge: 2020-04-13 | Disposition: A | Discharge status: Home / Self Care | Payer: 59 | Source: Ambulatory Visit | Attending: Cardiovascular Disease | Admitting: Cardiovascular Disease

## 2020-04-13 DIAGNOSIS — I10 Essential (primary) hypertension: Secondary | ICD-10-CM

## 2020-04-13 NOTE — Telephone Encounter (Signed)
OK to give return to work note.

## 2020-04-13 NOTE — Telephone Encounter (Signed)
Patient was in office so a test, he asked if we could let Dr.K[place] know that he would like to get a note so he can return to work.

## 2020-04-15 ENCOUNTER — Telehealth: Payer: Self-pay | Admitting: *Deleted

## 2020-04-15 NOTE — Telephone Encounter (Signed)
Patient notified of sleep study appointment details. 

## 2020-04-20 ENCOUNTER — Other Ambulatory Visit (HOSPITAL_COMMUNITY): Payer: 59

## 2020-04-20 NOTE — Telephone Encounter (Signed)
Pt called in to check on the status of the work note   Best number [phone]

## 2020-04-20 NOTE — Telephone Encounter (Signed)
Follow Up:    Pt says he needs his note for work please. He needs you to fax it to [phone].Please let him know once you faxed it.

## 2020-04-21 ENCOUNTER — Encounter: Payer: Self-pay | Admitting: *Deleted

## 2020-04-21 NOTE — Telephone Encounter (Signed)
Discussed with Dr Duke Salvia, no lifting over 15 pounds Letter done and faxed as requested, patient aware

## 2020-04-22 LAB — BASIC METABOLIC PANEL
BUN/Creatinine Ratio: 14 (ref 9–20)
BUN: 13 mg/dL (ref 6–24)
CO2: 22 mmol/L (ref 20–29)
Calcium: 9.8 mg/dL (ref 8.7–10.2)
Chloride: 103 mmol/L (ref 96–106)
Creatinine, Ser: 0.92 mg/dL (ref 0.76–1.27)
GFR calc Af Amer: 116 mL/min/{1.73_m2} (ref >59)
GFR calc non Af Amer: 100 mL/min/{1.73_m2} (ref >59)
Glucose: 93 mg/dL (ref 65–99)
Potassium: 4.2 mmol/L (ref 3.5–5.2)
Sodium: 136 mmol/L (ref 134–144)

## 2020-05-25 ENCOUNTER — Ambulatory Visit (HOSPITAL_BASED_OUTPATIENT_CLINIC_OR_DEPARTMENT_OTHER): Payer: 59 | Admitting: Cardiovascular Disease

## 2020-05-27 ENCOUNTER — Ambulatory Visit (HOSPITAL_BASED_OUTPATIENT_CLINIC_OR_DEPARTMENT_OTHER): Payer: 59 | Attending: Cardiovascular Disease | Admitting: Cardiovascular Disease

## 2020-05-27 ENCOUNTER — Other Ambulatory Visit: Payer: Self-pay

## 2020-05-27 DIAGNOSIS — R400 Somnolence: Secondary | ICD-10-CM | POA: Diagnosis not present

## 2020-05-27 DIAGNOSIS — R0683 Snoring: Secondary | ICD-10-CM

## 2020-05-27 DIAGNOSIS — R0902 Hypoxemia: Secondary | ICD-10-CM | POA: Diagnosis not present

## 2020-05-27 DIAGNOSIS — Z79899 Other long term (current) drug therapy: Secondary | ICD-10-CM | POA: Insufficient documentation

## 2020-05-27 DIAGNOSIS — G4730 Sleep apnea, unspecified: Secondary | ICD-10-CM | POA: Insufficient documentation

## 2020-05-27 DIAGNOSIS — G478 Other sleep disorders: Secondary | ICD-10-CM | POA: Insufficient documentation

## 2020-06-06 ENCOUNTER — Telehealth: Payer: Self-pay | Admitting: *Deleted

## 2020-06-06 ENCOUNTER — Encounter (HOSPITAL_BASED_OUTPATIENT_CLINIC_OR_DEPARTMENT_OTHER): Payer: Self-pay | Admitting: Cardiovascular Disease

## 2020-06-06 NOTE — Procedures (Signed)
    Patient Name: Ashdon, Gillson Date: 05/27/2020 Gender: Male D.O.B: 06-Jul-1976 Age (years): 44 Referring Provider: Chilton Si Height (inches): 67 Interpreting Physician: Nicki Guadalajara MD, ABSM Weight (lbs): 200 RPSGT: Armen Pickup BMI: 31 MRN: [id number] Neck Size: 18.00  CLINICAL INFORMATION Sleep Study Type: NPSG  Indication for sleep study: Snoring  Epworth Sleepiness Score: 5  SLEEP STUDY TECHNIQUE As per the AASM Manual for the Scoring of Sleep and Associated Events v2.3 (April 2016) with a hypopnea requiring 4% desaturations.  The channels recorded and monitored were frontal, central and occipital EEG, electrooculogram (EOG), submentalis EMG (chin), nasal and oral airflow, thoracic and abdominal wall motion, anterior tibialis EMG, snore microphone, electrocardiogram, and pulse oximetry.  MEDICATIONS albuterol (VENTOLIN HFA) 108 (90 Base) MCG/ACT inhaler carvedilol (COREG) 12.5 MG tablet empagliflozin (JARDIANCE) 10 MG TABS tablet sacubitril-valsartan (ENTRESTO) 97-103 MG spironolactone (ALDACTONE) 25 MG tablet  Medications self-administered by patient taken the night of the study : N/A  SLEEP ARCHITECTURE The study was initiated at 9:50:01 PM and ended at 4:24:24 AM.  Sleep onset time was 10.4 minutes and the sleep efficiency was 90.4%%. The total sleep time was 356.5 minutes.  Stage REM latency was 80.5 minutes.  The patient spent 4.2%% of the night in stage N1 sleep, 77.3%% in stage N2 sleep, 0.0%% in stage N3 and 18.5% in REM.  Alpha intrusion was absent.  Supine sleep was 57.08%.  RESPIRATORY PARAMETERS The overall apnea/hypopnea index (AHI) was 3.4 per hour.  The respiratory disturbance index (RDI) was 3.5/h.  There were 0 total apneas, including 0 obstructive, 0 central and 0 mixed apneas. There were 20 hypopneas and 1 RERAs.  The AHI during Stage REM sleep was 10.9 per hour.  AHI while supine was 4.4 per hour.  The mean oxygen  saturation was 92.2%. The minimum SpO2 during sleep was 75.0%.  Soft snoring was noted during this study.  CARDIAC DATA The 2 lead EKG demonstrated sinus rhythm. The mean heart rate was 67.4 beats per minute. Other EKG findings include: None.  LEG MOVEMENT DATA The total PLMS were 0 with a resulting PLMS index of 0.0. Associated arousal with leg movement index was 0.0 .  IMPRESSIONS - Increased upper airway resistance (UARS) without significant obstructive sleep apnea overall (AHI 3.4/h; RDI 3.5/h); however, mild sleep apnea was present during REM sleep (AHI 10.9/h). - No significant central sleep apnea occurred during this study (CAI 0.0/h). - Significant oxygen desaturation to a nadir of 75.0%. - The patient snored with soft snoring volume. - No cardiac abnormalities were noted during this study. - Clinically significant periodic limb movements did not occur during sleep. No significant associated arousals.  DIAGNOSIS - UARS - Sleep apnea, unspecified - Nocturnal Hypoxemia (G47.36)  RECOMMENDATIONS - At present patient does not meet critieria for CPAP. - Effort should be made to optimize nasal and oropharyngeal patency. - Consider follow-up overnight oximetry to re-assess for nocturnal hypoxemia and potential need for supplemental oxygen.  - Avoid alcohol, sedatives and other CNS depressants that may worsen sleep apnea and disrupt normal sleep architecture. - Sleep hygiene should be reviewed to assess factors that may improve sleep quality. - Weight management (BMI 31) and regular exercise should be initiated or continued if appropriate.  [Electronically signed] 06/06/2020 05:59 PM  Nicki Guadalajara MD, [facility], ABSM Diplomate, American Board of Sleep Medicine   NPI: [id number] [facility] PH: [phone]   FX: [id number] ACCREDITED BY THE AMERICAN ACADEMY OF SLEEP MEDICINE

## 2020-06-06 NOTE — Telephone Encounter (Signed)
Informed patient of sleep study results and patient understanding was verbalized. Patient understands her sleep study showed IMPRESSIONS - Increased upper airway resistance (UARS) without significant obstructive sleep apnea overall (AHI 3.4/h; RDI 3.5/h); however, mild sleep apnea was present during REM sleep (AHI 10.9/h). - Significant oxygen desaturation to a nadir of 75.0%. - The patient snored with soft snoring volume. - No cardiac abnormalities were noted during this study. - Clinically significant periodic limb movements did not occur during sleep. No significant associated arousals.  DIAGNOSIS - UARS - Sleep apnea, unspecified - Nocturnal Hypoxemia (G47.36)  RECOMMENDATIONS - At present patient does not meet critieria for CPAP. Pt is aware and agreeable to his results

## 2020-06-06 NOTE — Telephone Encounter (Signed)
-----   Message from [name], MD sent at 06/06/2020  6:07 PM EST ----- Alex Lee,  Please notify pt of the results

## 2020-06-25 ENCOUNTER — Telehealth: Payer: Self-pay | Admitting: *Deleted

## 2020-06-25 NOTE — Telephone Encounter (Signed)
Called results no answer no voice mail. Will send mychart message.

## 2020-06-29 NOTE — Telephone Encounter (Signed)
Informed patient of sleep study results and patient understanding was verbalized. Patient understands her sleep study showed   IMPRESSIONS - Increased upper airway resistance (UARS) without significant obstructive sleep apnea overall (AHI 3.4/h; RDI 3.5/h); however, mild sleep apnea was present during REM sleep (AHI 10.9/h). - Significant oxygen desaturation to a nadir of 75.0%. - The patient snored with soft snoring volume. - No cardiac abnormalities were noted during this study. - Clinically significant periodic limb movements did not occur during sleep. No significant associated arousals.  DIAGNOSIS - UARS - Sleep apnea, unspecified - Nocturnal Hypoxemia (G47.36)  RECOMMENDATIONS - At present patient does not meet critieria for CPAP  Pt is aware and agreeable to normal results

## 2020-07-05 ENCOUNTER — Telehealth: Payer: Self-pay

## 2020-07-05 NOTE — Telephone Encounter (Signed)
**Note De-Identified Alex Lee Obfuscation** I started a Jardiance PA through covermymeds: Key: [id number]

## 2020-07-05 NOTE — Telephone Encounter (Signed)
**Note De-Identified Harace Mccluney Obfuscation** I started a Entresto PA through covermymeds: Key: [id number]

## 2020-07-08 ENCOUNTER — Other Ambulatory Visit: Payer: Self-pay

## 2020-07-08 ENCOUNTER — Encounter: Payer: Self-pay | Admitting: Cardiology

## 2020-07-08 ENCOUNTER — Telehealth (HOSPITAL_COMMUNITY): Payer: Self-pay | Admitting: Internal Medicine

## 2020-07-08 ENCOUNTER — Ambulatory Visit (INDEPENDENT_AMBULATORY_CARE_PROVIDER_SITE_OTHER): Payer: Self-pay | Admitting: Cardiology

## 2020-07-08 VITALS — BP 118/82 | HR 74 | Ht 67.0 in | Wt 199.4 lb

## 2020-07-08 DIAGNOSIS — I5042 Chronic combined systolic (congestive) and diastolic (congestive) heart failure: Secondary | ICD-10-CM

## 2020-07-08 DIAGNOSIS — I428 Other cardiomyopathies: Secondary | ICD-10-CM

## 2020-07-08 DIAGNOSIS — I10 Essential (primary) hypertension: Secondary | ICD-10-CM

## 2020-07-08 NOTE — Patient Instructions (Signed)
Medication Instructions:  Continue current medications  *If you need a refill on your cardiac medications before your next appointment, please call your pharmacy*   Lab Work: None Ordered   Testing/Procedures: None Ordered   Follow-Up: At [facility], you and your health needs are our priority.  As part of our continuing mission to provide you with exceptional heart care, we have created designated Provider Care Teams.  These Care Teams include your primary Cardiologist (physician) and Advanced Practice Providers (APPs -  Physician Assistants and Nurse Practitioners) who all work together to provide you with the care you need, when you need it.  We recommend signing up for the patient portal called "MyChart".  Sign up information is provided on this After Visit Summary.  MyChart is used to connect with patients for Virtual Visits (Telemedicine).  Patients are able to view lab/test results, encounter notes, upcoming appointments, etc.  Non-urgent messages can be sent to your provider as well.   To learn more about what you can do with MyChart, go to [email or web address].    Your next appointment:   3 month(s)  The format for your next appointment:   In Person  Provider:   You may see Chilton Si, MD or one of the following Advanced Practice Providers on your designated Care Team:    Corine Shelter, PA-C  [place], [place]  Edd Fabian, [place]

## 2020-07-08 NOTE — Assessment & Plan Note (Signed)
Controlled.  

## 2020-07-08 NOTE — Progress Notes (Signed)
Cardiology Office Note:    Date:  07/08/2020   ID:  Alex Lee, DOB 09-Feb-1976, MRN [id number]  PCP:  Patient, No Pcp Per  Cardiologist:  Chilton Si, MD  Electrophysiologist:  None   Referring MD: No ref. provider found   No chief complaint on file.  History of Present Illness:    Alex Lee is a pleasant 44 y.o. male with a hx of a nonischemic cardiomyopathy.  He presented in June 2021 with heart failure.  Echocardiogram showed an ejection fraction of 25 to 30% with grade 2 diastolic dysfunction.  Coronary CTA was negative for coronary disease and had a calcium score of 0.  Other medical issues include treated hypertension.  There was a question of sleep apnea but his sleep study in November 2021 did not suggest obstructive sleep apnea.  The patient saw Dr. Duke Salvia in September 2021.  His follow-up echocardiogram unfortunately showed no improvement in his ejection fraction.  She was concerned about the etiology of his cardiomyopathy (? does he need an MRI).  She suggested referral to our advanced heart failure clinic for further evaluation, he has not received an appointment as yet and I will check on this today. Pending this evaluation he may be considered for an ICD- she was going to leave that decision to the CHF team.   The patient has done well on medical therapy.  His blood pressure is controlled and he is tolerating his medications.  He is able to walk for exercise without unusual dyspnea, has no otrthopnea or LE edema.   Past Medical History:  Diagnosis Date  . Childhood asthma   . Essential hypertension 03/03/2020  . Snoring 03/03/2020  . Tobacco abuse   . Tobacco abuse 04/08/2020    No past surgical history on file.  Current Medications: Current Meds  Medication Sig  . albuterol (VENTOLIN HFA) 108 (90 Base) MCG/ACT inhaler Inhale 2 puffs into the lungs every 6 (six) hours as needed for wheezing or shortness of breath.  . carvedilol (COREG) 12.5 MG tablet Take 1  tablet (12.5 mg total) by mouth 2 (two) times daily with a meal.  . empagliflozin (JARDIANCE) 10 MG TABS tablet Take 1 tablet (10 mg total) by mouth daily before breakfast.  . sacubitril-valsartan (ENTRESTO) 97-103 MG Take 1 tablet by mouth 2 (two) times daily.  Marland Kitchen spironolactone (ALDACTONE) 25 MG tablet Take 1 tablet (25 mg total) by mouth daily.     Allergies:   Patient has no known allergies.   Social History   Socioeconomic History  . Marital status: Married    Spouse name: Not on file  . Number of children: Not on file  . Years of education: Not on file  . Highest education level: Not on file  Occupational History  . Not on file  Tobacco Use  . Smoking status: Current Every Day Smoker    Packs/day: 0.50  . Smokeless tobacco: Never Used  Substance and Sexual Activity  . Alcohol use: Yes    Alcohol/week: 10.0 standard drinks    Types: 10 Cans of beer per week  . Drug use: Not Currently  . Sexual activity: Not on file  Other Topics Concern  . Not on file  Social History Narrative  . Not on file   Social Determinants of Health   Financial Resource Strain: Not on file  Food Insecurity: Not on file  Transportation Needs: Not on file  Physical Activity: Not on file  Stress: Not on file  Social  Connections: Not on file     Family History: The patient's family history includes Hypertension in his father and mother.  ROS:   Please see the history of present illness.     All other systems reviewed and are negative.  EKGs/Labs/Other Studies Reviewed:    The following studies were reviewed today: Echo Sept 2021- IMPRESSIONS    1. Left ventricular ejection fraction, by estimation, is 25 to 30%. The  left ventricle has severely decreased function. The left ventricle  demonstrates global hypokinesis. Left ventricular diastolic parameters are  consistent with Grade II diastolic  dysfunction (pseudonormalization).  2. Right ventricular systolic function is normal.  The right ventricular  size is normal. Tricuspid regurgitation signal is inadequate for assessing  PA pressure.  3. Left atrial size was mildly dilated.  4. The mitral valve is normal in structure. No evidence of mitral valve  regurgitation. No evidence of mitral stenosis.  5. The aortic valve is tricuspid. Aortic valve regurgitation is trivial.  No aortic stenosis is present.  6. The inferior vena cava is normal in size with greater than 50%  respiratory variability, suggesting right atrial pressure of 3 mmHg.   EKG:  EKG is not ordered today.  The ekg ordered 12/29/2019 demonstrates NSR, ST 112, LAFB, RSR' in V1-2, LVH with repol  Recent Labs: 12/27/2019: ALT 29; B Natriuretic Peptide 175.3 12/29/2019: Magnesium 2.2; TSH 1.147 12/30/2019: Hemoglobin 13.5; Platelets 259 04/22/2020: BUN 13; Creatinine, Ser 0.92; Potassium 4.2; Sodium 136  Recent Lipid Panel    Component Value Date/Time   CHOL 191 12/29/2019 0417   TRIG 84 12/29/2019 0417   HDL 33 (L) 12/29/2019 0417   CHOLHDL 5.8 12/29/2019 0417   VLDL 17 12/29/2019 0417   LDLCALC 141 (H) 12/29/2019 0417    Physical Exam:    VS:  BP 118/82   Pulse 74   Ht 5\' 7"  (1.702 m)   Wt 199 lb 6.4 oz (90.4 kg)   SpO2 97%   BMI 31.23 kg/m     Wt Readings from Last 3 Encounters:  07/08/20 199 lb 6.4 oz (90.4 kg)  05/27/20 200 lb (90.7 kg)  04/08/20 199 lb (90.3 kg)     GEN: Well nourished, well developed male in no acute distress HEENT: Normal NECK: No JVD; No carotid bruits CARDIAC: RRR, no murmurs, rubs, gallops RESPIRATORY:  Clear to auscultation without rales, wheezing or rhonchi  ABDOMEN: Soft,  non-distended MUSCULOSKELETAL:  No edema; No deformity  SKIN: Warm and dry NEUROLOGIC:  Alert and oriented x 3 PSYCHIATRIC:  Normal affect   ASSESSMENT:    Chronic combined systolic and diastolic heart failure (HCC) Currently stable on medical Rx- NYHA class 1-2  Non-ischemic cardiomyopathy (HCC) Dr 04/10/20 has  requested further evaluation by the CHF clinic- I called today, referral was placed in Sept. - MD may have been waiting for sleep study which did not show need for C-pap.   Essential hypertension Controlled  PLAN:    Same cardiac Rx.  Referral to CHF clinic for further evaluation.  F/U Dr 04-28-1977 in 3 months.    Medication Adjustments/Labs and Tests Ordered: Current medicines are reviewed at length with the patient today.  Concerns regarding medicines are outlined above.  Orders Placed This Encounter  Procedures  . AMB referral to CHF clinic   No orders of the defined types were placed in this encounter.   Patient Instructions  Medication Instructions:  Continue current medications  *If you need a refill on your cardiac  medications before your next appointment, please call your pharmacy*   Lab Work: None Ordered   Testing/Procedures: None Ordered   Follow-Up: At [facility], you and your health needs are our priority.  As part of our continuing mission to provide you with exceptional heart care, we have created designated Provider Care Teams.  These Care Teams include your primary Cardiologist (physician) and Advanced Practice Providers (APPs -  Physician Assistants and Nurse Practitioners) who all work together to provide you with the care you need, when you need it.  We recommend signing up for the patient portal called "MyChart".  Sign up information is provided on this After Visit Summary.  MyChart is used to connect with patients for Virtual Visits (Telemedicine).  Patients are able to view lab/test results, encounter notes, upcoming appointments, etc.  Non-urgent messages can be sent to your provider as well.   To learn more about what you can do with MyChart, go to [email or web address].    Your next appointment:   3 month(s)  The format for your next appointment:   In Person  Provider:   You may see Chilton Si, MD or one of the following Advanced  Practice Providers on your designated Care Team:    Corine Shelter, PA-C  [place], [place]  Edd Fabian, FNP         Signed, Corine Shelter, [place]  07/08/2020 11:39 AM    [facility]

## 2020-07-08 NOTE — Assessment & Plan Note (Addendum)
Dr Duke Salvia has requested further evaluation by the CHF clinic- I called today, referral was placed in Sept. - MD may have been waiting for sleep study which did not show need for C-pap.

## 2020-07-08 NOTE — Assessment & Plan Note (Signed)
Currently stable on medical Rx- NYHA class 1-2

## 2020-07-08 NOTE — Telephone Encounter (Signed)
New pt referral.  

## 2020-07-16 ENCOUNTER — Telehealth (HOSPITAL_COMMUNITY): Payer: Self-pay | Admitting: Vascular Surgery

## 2020-07-16 NOTE — Telephone Encounter (Signed)
Left pt message giving new pt appt w/ db, asked pt to call back to confirm appt, new pt packet sent w/ appt , date, time and directions to our office

## 2020-08-05 ENCOUNTER — Other Ambulatory Visit: Payer: Self-pay | Admitting: Cardiology

## 2020-08-05 ENCOUNTER — Telehealth: Payer: Self-pay | Admitting: *Deleted

## 2020-08-05 MED ORDER — CARVEDILOL 12.5 MG PO TABS: 12.5000 mg | ORAL_TABLET | Freq: Two times a day (BID) | ORAL | 5 refills | Status: DC

## 2020-08-05 NOTE — Telephone Encounter (Signed)
Attempted to call patient x 3-unable to reach.   Will send Mychart message to call office to discuss.

## 2020-08-05 NOTE — Telephone Encounter (Signed)
Attempted to call patient again to discuss mychart message.  No answer and unable to leave VM.

## 2020-08-06 ENCOUNTER — Telehealth: Payer: Self-pay | Admitting: Cardiovascular Disease

## 2020-08-06 NOTE — Telephone Encounter (Signed)
Patient calling the office for samples of medication:   1.  What medication and dosage are you requesting samples for? Alex Lee, Carvedilol  2.  Are you currently out of this medication? Yes patient insurance change it will a few more weeks please advise

## 2020-08-06 NOTE — Telephone Encounter (Signed)
Attempted to contact pt. Number provided stated it's no longer in services and both numbers listed on the chart voicemail is not set up .

## 2020-08-06 NOTE — Telephone Encounter (Signed)
Mychart message sent stating we don't have samples for Entresto but London Pepper is placed up front for pick up.  Jardiance 10 mg Qty: 2 bottles Lot # [id number] Exp:9/23

## 2020-08-23 ENCOUNTER — Telehealth: Payer: Self-pay | Admitting: Cardiovascular Disease

## 2020-08-23 NOTE — Telephone Encounter (Signed)
Pt c/o medication issue:  1. Name of Medication: sacubitril-valsartan (ENTRESTO) 97-103 MG  2. How are you currently taking this medication (dosage and times per day)? Pt has not taken for ~3 weeks   3. Are you having a reaction (difficulty breathing--STAT)?   4. What is your medication issue? Cost.  Wife wanted to know if there was an alternate medication that he could be put on. The medication is too expensive

## 2020-08-23 NOTE — Telephone Encounter (Signed)
Patients wife calling back, states she needs to ask the nurse something else. Please advise.

## 2020-08-23 NOTE — Telephone Encounter (Signed)
Patient's wife returning call. 

## 2020-08-23 NOTE — Telephone Encounter (Signed)
Attempted to call patient, left message for patient to call back to office.   

## 2020-08-23 NOTE — Telephone Encounter (Signed)
Returned call to patients wife who states that patient has been out of his Sherryll Burger for almost a month. Patient's wife states that they have not tried patient assistance and unsure if patient has used 30 day free trial card or not. Patient's wife would like to know if there is an alternative medication.   Patient's wife states that patient has had a headache since being off of the medication. Per patients wife, patients last blood pressure was 138/75 and reports a normal heart rate but unable to provide readings.   Advised patients wife that the 30 day free trial card and application for patient assistance will be left at the front desk for pick up, and that this message will be forwarded to Dr. Duke Salvia for review. Patient's wife verbalized understanding.

## 2020-08-23 NOTE — Telephone Encounter (Signed)
Left message to call back  

## 2020-08-23 NOTE — Telephone Encounter (Signed)
Spoke with patients wife who was calling back to check on the status of patients disability paperwork. Per patients wife the paper work has been sent to the office to be filled out. Patient's wife states that she would like to try and expedite this as the disability affects the patient getting approved for Medicaid. Advised patients wife I would forward message to Dr. Sunday Spillers nurse to check on status of this. Patient's wife verbalized understanding.

## 2020-08-25 NOTE — Telephone Encounter (Signed)
Follow Up:   Pt's wife is returning your call.   

## 2020-08-25 NOTE — Telephone Encounter (Signed)
Left message to call back  

## 2020-08-26 MED ORDER — SACUBITRIL-VALSARTAN 97-103 MG PO TABS: 1.0000 | ORAL_TABLET | Freq: Two times a day (BID) | ORAL | 3 refills | Status: DC

## 2020-08-26 NOTE — Telephone Encounter (Signed)
Patient is returning call.  °

## 2020-08-26 NOTE — Telephone Encounter (Signed)
Discussed with patient, wife at work Will try to call wife tomorrow before 2

## 2020-08-27 NOTE — Telephone Encounter (Signed)
Left message to call back  

## 2020-08-30 NOTE — Telephone Encounter (Signed)
Pt's wife returning call.

## 2020-09-02 NOTE — Telephone Encounter (Signed)
First opportunity to call patients wife before 2:00 pm, per husband she goes in to work then Left message to call back

## 2020-09-08 ENCOUNTER — Other Ambulatory Visit: Payer: Self-pay

## 2020-09-08 MED ORDER — SACUBITRIL-VALSARTAN 97-103 MG PO TABS: 1.0000 | ORAL_TABLET | Freq: Two times a day (BID) | ORAL | 3 refills | Status: DC

## 2020-09-08 MED ORDER — CARVEDILOL 12.5 MG PO TABS: 12.5000 mg | ORAL_TABLET | Freq: Two times a day (BID) | ORAL | 3 refills | Status: DC

## 2020-09-08 MED ORDER — SPIRONOLACTONE 25 MG PO TABS: 25.0000 mg | ORAL_TABLET | Freq: Every day | ORAL | 3 refills | Status: DC

## 2020-09-08 MED ORDER — EMPAGLIFLOZIN 10 MG PO TABS
10.0000 mg | ORAL_TABLET | Freq: Every day | ORAL | 3 refills | Status: DC
Start: 2020-09-08 — End: 2021-08-05

## 2020-09-08 NOTE — Progress Notes (Signed)
ADVANCED HF CLINIC CONSULT NOTE  Referring Physician: Chilton Si, MD Primary Care: Patient, No Pcp Per Primary Cardiologist: Chilton Si, MD  HPI:  Alex Lee is a 45 y.o. 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 Duke Salvia for further evaluation of his HF.  He presented in June 2021 with acute HF.  Echo EF 25-30% with G2DD.  Coronary CTA was negative for coronary disease and had a calcium score of 0. There was a question of sleep apnea but his sleep study in November 2021 did not suggest obstructive sleep apnea. He saw Dr. Duke Salvia in September 2021.  His follow-up echo showed no improvement in his EF.   He is here with his wife who is a Lawyer. Feels pretty good. Says he gets SOB if he does a lot of walking or goes up steps.. Can go to [employer] without problem. No CP, edema, orthopnea or PND. Wife says he snores but "not too bad."  BP at home 120-130/80. Smokes a couple cigs per week. Social beer.  Sleep study 11/21 AHI 3   No significant FHx HF   Review of Systems: [y] = yes, [ ]  = no   General: Weight gain [ ] ; Weight loss [ ] ; Anorexia [ ] ; Fatigue [ ] ; Fever [ ] ; Chills [ ] ; Weakness [ ]   Cardiac: Chest pain/pressure [ ] ; Resting SOB [ ] ; Exertional SOB [ y]; Orthopnea [ ] ; Pedal Edema [ ] ; Palpitations [ ] ; Syncope [ ] ; Presyncope [ ] ; Paroxysmal nocturnal dyspnea[ ]   Pulmonary: Cough [ ] ; Wheezing[ ] ; Hemoptysis[ ] ; Sputum [ ] ; Snoring [ ]   GI: Vomiting[ ] ; Dysphagia[ ] ; Melena[ ] ; Hematochezia [ ] ; Heartburn[ ] ; Abdominal pain [ ] ; Constipation [ ] ; Diarrhea [ ] ; BRBPR [ ]   GU: Hematuria[ ] ; Dysuria [ ] ; Nocturia[ ]   Vascular: Pain in legs with walking [ ] ; Pain in feet with lying flat [ ] ; Non-healing sores [ ] ; Stroke [ ] ; TIA [ ] ; Slurred speech [ ] ;  Neuro: Headaches[ ] ; Vertigo[ ] ; Seizures[ ] ; Paresthesias[ ] ;Blurred vision [ ] ; Diplopia [ ] ; Vision changes [ ]   Ortho/Skin: Arthritis [ y]; Joint pain [ ] y; Muscle pain [ ] ;  Joint swelling [ ] ; Back Pain [ ] ; Rash [ ]   Psych: Depression[ ] ; Anxiety[ ]   Heme: Bleeding problems [ ] ; Clotting disorders [ ] ; Anemia [ ]   Endocrine: Diabetes [ ] ; Thyroid dysfunction[ ]    Past Medical History:  Diagnosis Date  . Childhood asthma   . Essential hypertension 03/03/2020  . Snoring 03/03/2020  . Tobacco abuse   . Tobacco abuse 04/08/2020    Current Outpatient Medications  Medication Sig Dispense Refill  . albuterol (VENTOLIN HFA) 108 (90 Base) MCG/ACT inhaler Inhale 2 puffs into the lungs every 6 (six) hours as needed for wheezing or shortness of breath. 18 g 0  . carvedilol (COREG) 12.5 MG tablet Take 1 tablet (12.5 mg total) by mouth 2 (two) times daily with a meal. 180 tablet 3  . empagliflozin (JARDIANCE) 10 MG TABS tablet Take 1 tablet (10 mg total) by mouth daily before breakfast. 90 tablet 3  . sacubitril-valsartan (ENTRESTO) 97-103 MG Take 1 tablet by mouth 2 (two) times daily. 180 tablet 3  . spironolactone (ALDACTONE) 25 MG tablet Take 1 tablet (25 mg total) by mouth daily. 90 tablet 3   No current facility-administered medications for this encounter.    No Known Allergies    Social History  Socioeconomic History  . Marital status: Married    Spouse name: Not on file  . Number of children: Not on file  . Years of education: Not on file  . Highest education level: Not on file  Occupational History  . Not on file  Tobacco Use  . Smoking status: Current Every Day Smoker    Packs/day: 0.50  . Smokeless tobacco: Never Used  Substance and Sexual Activity  . Alcohol use: Yes    Alcohol/week: 10.0 standard drinks    Types: 10 Cans of beer per week  . Drug use: Not Currently  . Sexual activity: Not on file  Other Topics Concern  . Not on file  Social History Narrative  . Not on file   Social Determinants of Health   Financial Resource Strain: Not on file  Food Insecurity: Not on file  Transportation Needs: Not on file  Physical Activity: Not  on file  Stress: Not on file  Social Connections: Not on file  Intimate Partner Violence: Not on file      Family History  Problem Relation Age of Onset  . Hypertension Mother   . Hypertension Father     Vitals:   09/09/20 1149  BP: 124/70  Pulse: 91  SpO2: 96%  Weight: 89.4 kg (197 lb 3.2 oz)    PHYSICAL EXAM: General:  Well appearing. No respiratory difficulty HEENT: normal Neck: supple. no JVD. Carotids 2+ bilat; no bruits. No lymphadenopathy or thryomegaly appreciated. Cor: PMI nondisplaced. Regular rate & rhythm. No rubs, gallops or murmurs. Lungs: clear Abdomen: soft, nontender, nondistended. No hepatosplenomegaly. No bruits or masses. Good bowel sounds. Extremities: no cyanosis, clubbing, rash, edema Neuro: alert & oriented x 3, cranial nerves grossly intact. moves all 4 extremities w/o difficulty. Affect pleasant.  ECG: NSR 90 LVH with repol QRS 32ms Personally reviewed   ASSESSMENT & PLAN:  1. Chronic systolic HF due to NICM - possible HTN CM - Echo 6/21 EF 25-30% G2DD RV ok - CTA 6/21 no CAD - Echo 9/21 EF 25-30% - NYHA II - Volume status ok  - Continue Entresto 97/103 bid - Increase carvedilol to 18.75 bid  - Continue Jardiance 10 daily - Continue spiro 25mg  daily - Bedside echo today in clinic EF 55% (EF fully recovered)   2. Essential HTN - Blood pressure well controlled. Continue current regimen.  3. Tobacco use - encouraged cessation   EF has normalized on bedside echo today. Will get cMRI to confirm EF recovery and rule out and infiltrative or other cardiomyopathic process. On good GDMT and is doing well. Will have him f/u with Dr. and return here as needed.  Duke Salvia, MD  11:05 PM

## 2020-09-09 ENCOUNTER — Encounter (HOSPITAL_COMMUNITY): Payer: Self-pay | Admitting: Internal Medicine

## 2020-09-09 ENCOUNTER — Ambulatory Visit (HOSPITAL_COMMUNITY)
Admission: RE | Admit: 2020-09-09 | Discharge: 2020-09-09 | Disposition: A | Discharge status: Home / Self Care | Payer: Medicaid Other | Source: Ambulatory Visit | Attending: Internal Medicine | Admitting: Internal Medicine

## 2020-09-09 VITALS — BP 124/70 | HR 91 | Wt 197.2 lb

## 2020-09-09 DIAGNOSIS — I110 Hypertensive heart disease with heart failure: Secondary | ICD-10-CM | POA: Insufficient documentation

## 2020-09-09 DIAGNOSIS — Z79899 Other long term (current) drug therapy: Secondary | ICD-10-CM | POA: Insufficient documentation

## 2020-09-09 DIAGNOSIS — I10 Essential (primary) hypertension: Secondary | ICD-10-CM

## 2020-09-09 DIAGNOSIS — Z8249 Family history of ischemic heart disease and other diseases of the circulatory system: Secondary | ICD-10-CM | POA: Insufficient documentation

## 2020-09-09 DIAGNOSIS — Z720 Tobacco use: Secondary | ICD-10-CM

## 2020-09-09 DIAGNOSIS — I5042 Chronic combined systolic (congestive) and diastolic (congestive) heart failure: Secondary | ICD-10-CM

## 2020-09-09 DIAGNOSIS — F17210 Nicotine dependence, cigarettes, uncomplicated: Secondary | ICD-10-CM | POA: Insufficient documentation

## 2020-09-09 DIAGNOSIS — I5022 Chronic systolic (congestive) heart failure: Secondary | ICD-10-CM | POA: Insufficient documentation

## 2020-09-09 DIAGNOSIS — I428 Other cardiomyopathies: Secondary | ICD-10-CM | POA: Insufficient documentation

## 2020-09-09 HISTORY — DX: Heart failure, unspecified: I50.9

## 2020-09-09 LAB — BASIC METABOLIC PANEL
Anion gap: 11 (ref 5–15)
BUN: 19 mg/dL (ref 6–20)
CO2: 20 mmol/L — ABNORMAL LOW (ref 22–32)
Calcium: 9 mg/dL (ref 8.9–10.3)
Chloride: 107 mmol/L (ref 98–111)
Creatinine, Ser: 1.13 mg/dL (ref 0.61–1.24)
GFR, Estimated: >60 mL/min (ref >60)
Glucose, Bld: 89 mg/dL (ref 70–99)
Potassium: 4.1 mmol/L (ref 3.5–5.1)
Sodium: 138 mmol/L (ref 135–145)

## 2020-09-09 LAB — BRAIN NATRIURETIC PEPTIDE: B Natriuretic Peptide: 13.6 pg/mL (ref 0.0–100.0)

## 2020-09-09 MED ORDER — CARVEDILOL 12.5 MG PO TABS
18.7500 mg | ORAL_TABLET | Freq: Two times a day (BID) | ORAL | 6 refills | Status: DC
Start: 2020-09-09 — End: 2021-06-08

## 2020-09-09 NOTE — Patient Instructions (Addendum)
Increase Carvedilol to 18.75 mg (1 & 1/2 tabs) Twice daily   Labs done today, your results will be available in MyChart, we will contact you for abnormal readings.  Your physician has requested that you have a cardiac MRI. Cardiac MRI uses a computer to create images of your heart as its beating, producing both still and moving pictures of your heart and major blood vessels. For further information please visit [email or web address]. Please follow the instruction sheet given to you today for more information. ONCE APPROVED BY YOUR INSURANCE COMPANY WE WILL CONTACT YOU TO SCHEDULE  Your physician recommends that you schedule a follow-up appointment with Dr Duke Salvia as scheduled 10/13/20   If you have any questions or concerns before your next appointment please send [place] a message through [place] or call our office at [phone].    TO LEAVE A MESSAGE FOR THE NURSE SELECT OPTION 2, PLEASE LEAVE A MESSAGE INCLUDING: . YOUR NAME . DATE OF BIRTH . CALL BACK NUMBER . REASON FOR CALL**this is important as we prioritize the call backs  YOU WILL RECEIVE A CALL BACK THE SAME DAY AS LONG AS YOU CALL BEFORE 4:00 PM  At the [facility], you and your health needs are our priority. As part of our continuing mission to provide you with exceptional heart care, we have created designated Provider Care Teams. These Care Teams include your primary Cardiologist (physician) and Advanced Practice Providers (APPs- Physician Assistants and Nurse Practitioners) who all work together to provide you with the care you need, when you need it.   You may see any of the following providers on your designated Care Team at your next follow up: Marland Kitchen Dr Arvilla Meres . Dr Marca Ancona . Dr Thornell Mule . Tonye Becket, NP . Robbie Lis, PA . Shanda Bumps [place],NP . Karle Plumber, PharmD   Please be sure to bring in all your medications bottles to every appointment.

## 2020-09-29 ENCOUNTER — Other Ambulatory Visit: Payer: Self-pay | Admitting: *Deleted

## 2020-10-04 MED ORDER — SACUBITRIL-VALSARTAN 97-103 MG PO TABS: 1.0000 | ORAL_TABLET | Freq: Two times a day (BID) | ORAL | 3 refills | Status: DC

## 2020-10-13 ENCOUNTER — Other Ambulatory Visit: Payer: Self-pay

## 2020-10-13 ENCOUNTER — Encounter: Payer: Self-pay | Admitting: Cardiovascular Disease

## 2020-10-13 ENCOUNTER — Ambulatory Visit (INDEPENDENT_AMBULATORY_CARE_PROVIDER_SITE_OTHER): Payer: Self-pay | Admitting: Cardiovascular Disease

## 2020-10-13 VITALS — BP 158/74 | HR 87 | Ht 67.0 in | Wt 198.8 lb

## 2020-10-13 DIAGNOSIS — I5042 Chronic combined systolic (congestive) and diastolic (congestive) heart failure: Secondary | ICD-10-CM

## 2020-10-13 DIAGNOSIS — I428 Other cardiomyopathies: Secondary | ICD-10-CM

## 2020-10-13 DIAGNOSIS — Z720 Tobacco use: Secondary | ICD-10-CM

## 2020-10-13 DIAGNOSIS — I10 Essential (primary) hypertension: Secondary | ICD-10-CM

## 2020-10-13 MED ORDER — SACUBITRIL-VALSARTAN 97-103 MG PO TABS: 1.0000 | ORAL_TABLET | Freq: Two times a day (BID) | ORAL | 3 refills | Status: DC

## 2020-10-13 NOTE — Patient Instructions (Addendum)
Medication Instructions:  Continue current medications  *If you need a refill on your cardiac medications before your next appointment, please call your pharmacy*  Lab Work: None Ordered  Testing/Procedures: SCHEDULE CARDIAC MRA  Follow-Up: At [facility], you and your health needs are our priority.  As part of our continuing mission to provide you with exceptional heart care, we have created designated Provider Care Teams.  These Care Teams include your primary Cardiologist (physician) and Advanced Practice Providers (APPs -  Physician Assistants and Nurse Practitioners) who all work together to provide you with the care you need, when you need it.  We recommend signing up for the patient portal called "MyChart".  Sign up information is provided on this After Visit Summary.  MyChart is used to connect with patients for Virtual Visits (Telemedicine).  Patients are able to view lab/test results, encounter notes, upcoming appointments, etc.  Non-urgent messages can be sent to your provider as well.   To learn more about what you can do with MyChart, go to [email or web address].    Your next appointment:   6 month(s)  The format for your next appointment:   In Person  Provider:   DR [facility]

## 2020-10-13 NOTE — Telephone Encounter (Signed)
Spoke with wife at appointment, nothing needed with forms at this time

## 2020-10-13 NOTE — Progress Notes (Signed)
Cardiology Office Note  Date:  10/13/2020   ID:  Alex Lee, DOB [birth year], MRN [id number]  PCP:  Patient, No Pcp Per (Inactive)  Cardiologist:   Chilton Si, MD   No chief complaint on file.   History of Present Illness: Alex Lee is a 46 y.o. male chronic systolic diastolic heart failure (LVEF recovered), hypertension, asthma, tobacco abuse, and hyperlipidemia who presents for follow-up.  He was initially seen 12/2019 in the hospital with acute onset systolic dysfunction.  He presented with two days of shortness of breath.LVEF was 20 to 25%.  He underwent coronary CT-a which had a calcium score of 0 and no evidence of coronary artery disease.  He reported drinking 2-3 drinks daily at the time.  He was discharged on carvedilol and Entresto.  Entresto was increased 01/2020.  Spironolactone and Jardiance were added to his regimen due to poorly controlled blood pressures.  He had a follow-up echocardiogram 03/2020 that revealed LVEF 25 to 30% with global hypokinesis and grade 2 diastolic dysfunction.  Right ventricular function was normal.  He was seen in the advanced HF clinic and carvedilol was increased 09/2020.  LVEF in clinic on 09/2020 was 55%.  Cardiac MRI is pending.  He was discharged from HF clinic.  Alex Lee has been feeling well.  He has been trying to walk for exercise most days of the week.  He feels well and has no exertional chest pain or shortness of breath.  He has not experienced any lower extremity edema, orthopnea, or PND.  He has been out of his Sherryll Burger for the last couple days.  There was some confusion with the mail order pharmacy.  Otherwise he has been well and is without complaint.  He is unsure why the MRI has not been completed yet.   Past Medical History:  Diagnosis Date  . CHF (congestive heart failure) (HCC)   . Childhood asthma   . Essential hypertension 03/03/2020  . Snoring 03/03/2020  . Tobacco abuse   . Tobacco abuse 04/08/2020    History reviewed.  No pertinent surgical history.   Current Outpatient Medications  Medication Sig Dispense Refill  . albuterol (VENTOLIN HFA) 108 (90 Base) MCG/ACT inhaler Inhale 2 puffs into the lungs every 6 (six) hours as needed for wheezing or shortness of breath. 18 g 0  . carvedilol (COREG) 12.5 MG tablet Take 1.5 tablets (18.75 mg total) by mouth 2 (two) times daily with a meal. 60 tablet 6  . empagliflozin (JARDIANCE) 10 MG TABS tablet Take 1 tablet (10 mg total) by mouth daily before breakfast. 90 tablet 3  . spironolactone (ALDACTONE) 25 MG tablet Take 1 tablet (25 mg total) by mouth daily. 90 tablet 3  . sacubitril-valsartan (ENTRESTO) 97-103 MG Take 1 tablet by mouth 2 (two) times daily. 180 tablet 3   No current facility-administered medications for this visit.    Allergies:   Patient has no known allergies.    Social History:  The patient  reports that he has been smoking. He has been smoking about 0.50 packs per day. He has never used smokeless tobacco. He reports current alcohol use of about 10.0 standard drinks of alcohol per week. He reports previous drug use.   Family History:  The patient's family history includes Hypertension in his father and mother.    ROS:  Please see the history of present illness.   Otherwise, review of systems are positive for none.   All other systems are reviewed and negative.  PHYSICAL EXAM: VS:  BP (!) 158/74   Pulse 87   Ht 5\' 7"  (1.702 m)   Wt 198 lb 12.8 oz (90.2 kg)   BMI 31.14 kg/m  , BMI Body mass index is 31.14 kg/m. GENERAL:  Well appearing HEENT:  Pupils equal round and reactive, fundi not visualized, oral mucosa unremarkable NECK:  No jugular venous distention, waveform within normal limits, carotid upstroke brisk and symmetric, no bruits LUNGS:  Clear to auscultation bilaterally HEART:  RRR.  PMI not displaced or sustained,S1 and S2 within normal limits, no S3, no S4, no clicks, no rubs, no murmurs ABD:  Flat, positive bowel sounds  normal in frequency in pitch, no bruits, no rebound, no guarding, no midline pulsatile mass, no hepatomegaly, no splenomegaly EXT:  2 plus pulses throughout, no edema, no cyanosis no clubbing SKIN:  No rashes no nodules NEURO:  Cranial nerves II through XII grossly intact, motor grossly intact throughout PSYCH:  Cognitively intact, oriented to person place and time   EKG:  EKG is not ordered today.  03/2020: 1. Left ventricular ejection fraction, by estimation, is 25 to 30%. The  left ventricle has severely decreased function. The left ventricle  demonstrates global hypokinesis. Left ventricular diastolic parameters are  consistent with Grade II diastolic  dysfunction (pseudonormalization).  2. Right ventricular systolic function is normal. The right ventricular  size is normal. Tricuspid regurgitation signal is inadequate for assessing  PA pressure.  3. Left atrial size was mildly dilated.  4. The mitral valve is normal in structure. No evidence of mitral valve  regurgitation. No evidence of mitral stenosis.  5. The aortic valve is tricuspid. Aortic valve regurgitation is trivial.  No aortic stenosis is present.  6. The inferior vena cava is normal in size with greater than 50%  respiratory variability, suggesting right atrial pressure of 3 mmHg.   Echocardiogram6/20/2021: 1. Severely reduced LVEF 20-25% with diffuse hypokinesis, mildly dilated  left ventricle and left atrium, grade 2 diastolic dysfunction with  elevated filling pressures. RVEF appears normal.  2. Left ventricular ejection fraction, by estimation, is 20 to 25%. The  left ventricle has severely decreased function. The left ventricle  demonstrates global hypokinesis. The left ventricular internal cavity size  was mildly dilated. Left ventricular  diastolic parameters are consistent with Grade II diastolic dysfunction  (pseudonormalization). Elevated left ventricular end-diastolic pressure.  3. Right  ventricular systolic function is normal. The right ventricular  size is normal. There is normal pulmonary artery systolic pressure.  4. Left atrial size was mildly dilated.  5. The mitral valve is normal in structure. No evidence of mitral valve  regurgitation. No evidence of mitral stenosis.  6. The aortic valve is normal in structure. Aortic valve regurgitation is  mild. Mild to moderate aortic valve sclerosis/calcification is present,  without any evidence of aortic stenosis.  7. The inferior vena cava is normal in size with greater than 50%  respiratory variability, suggesting right atrial pressure of 3 mmHg.  Coronary CTA 12/29/2019 1. Coronary calcium score of 0. This was 0 percentile for age and sex matched control. 2. Normal coronary origin with right dominance. 3. No evidence of CAD.   Recent Labs: 12/27/2019: ALT 29 12/29/2019: Magnesium 2.2; TSH 1.147 12/30/2019: Hemoglobin 13.5; Platelets 259 09/09/2020: B Natriuretic Peptide 13.6; BUN 19; Creatinine, Ser 1.13; Potassium 4.1; Sodium 138    Lipid Panel    Component Value Date/Time   CHOL 191 12/29/2019 0417   TRIG 84 12/29/2019 0417  HDL 33 (L) 12/29/2019 0417   CHOLHDL 5.8 12/29/2019 0417   VLDL 17 12/29/2019 0417   LDLCALC 141 (H) 12/29/2019 0417      Wt Readings from Last 3 Encounters:  10/13/20 198 lb 12.8 oz (90.2 kg)  09/09/20 197 lb 3.2 oz (89.4 kg)  07/08/20 199 lb 6.4 oz (90.4 kg)      ASSESSMENT AND PLAN:  # Chronic systolic and diastolic heart failure: Non-ischemic cardiomyopathy.  LVEF 20 to 25% and improved mildly to 25 to 30%.  He was referred to advanced heart failure clinic but when he went there bedside echo showed that his LVEF had improved to 55%.  MRI is pending.  We will make sure he gets this and his Entresto refilled.  Continue Entresto, carvedilol, spironolactone, and Jardiance.  No indication for ICD at this time.    # Essential hypertension: Blood pressure is poorly controlled as  above.  Increasing carvedilol and Entresto.  We will also get renal artery Dopplers.  Sleep study is pending.  TSH was normal 12/2019.  # Snoring: # Daytime somnolence: Sleep study not performed.  # Tobacco abuse:  Encouraged him to keep cutting back.   Current medicines are reviewed at length with the patient today.  The patient does not have concerns regarding medicines.  The following changes have been made:  Increase Entresto and carvedilol.  Start [place].   Labs/ tests ordered today include:   No orders of the defined types were placed in this encounter.    Disposition:   FU with Tilla Wilborn C. Duke Salvia, MD, [facility] in 6 months.      Signed, Brodi Kari C. Duke Salvia, MD, [facility]  10/13/2020 12:32 PM    [facility]

## 2020-10-14 ENCOUNTER — Encounter: Payer: Self-pay | Admitting: Internal Medicine

## 2020-10-14 ENCOUNTER — Telehealth: Payer: Self-pay | Admitting: Internal Medicine

## 2020-10-14 NOTE — Telephone Encounter (Signed)
Spoke with wife regarding the Tuesday 11/30/20 12:00PM Cardiac MRI appointment at [facility].  Arrival time is 11:30am --1st floor admissions office for check in ---will mail information to patient and it is also in My Chart.

## 2020-10-26 ENCOUNTER — Telehealth: Payer: Self-pay | Admitting: Cardiovascular Disease

## 2020-10-26 ENCOUNTER — Encounter (HOSPITAL_BASED_OUTPATIENT_CLINIC_OR_DEPARTMENT_OTHER): Payer: Self-pay

## 2020-10-26 NOTE — Telephone Encounter (Signed)
Patient called to see if we can send over a letter saying that there is not restriction for the patient. He said what was sent earlier was sufficient of enough for the job. The fax [phone]

## 2020-10-26 NOTE — Telephone Encounter (Signed)
Alex Lee is calling requesting this release form for work be filled out today and sent by 12 PM. He states the fax number is [phone]. Please advise. If callback is necessary his best number is [phone]. Please advise.

## 2020-10-26 NOTE — Telephone Encounter (Signed)
Spoke with patient of Dr. Duke Salvia. His job needs a note saying he can return to work.  He says he will not have restrictions. He maneuvers large machines/equipment. He is not on his feet a lot  Advised patient there is not a mention of returning to work in MD 10/13/20 note  Advised I will send to Dr. Duke Salvia (who is out of office) and she may have access to her basket. Will also send to DOD to see if he will review (?)

## 2020-10-26 NOTE — Telephone Encounter (Signed)
Letter composed with updates per patient. Faxed and sent via MyChart

## 2020-11-26 ENCOUNTER — Telehealth (HOSPITAL_COMMUNITY): Payer: Self-pay | Admitting: Emergency Medicine

## 2020-11-26 NOTE — Telephone Encounter (Signed)
Attempted to call patient regarding upcoming cardiac MR appointment. Left message on voicemail with name and callback number Reid Nawrot RN Navigator Cardiac Imaging [facility] [phone] Office [phone] Cell  

## 2020-11-30 ENCOUNTER — Ambulatory Visit (HOSPITAL_COMMUNITY): Admission: RE | Admit: 2020-11-30 | Payer: Self-pay | Source: Ambulatory Visit

## 2020-12-31 ENCOUNTER — Telehealth (HOSPITAL_COMMUNITY): Payer: Self-pay | Admitting: Emergency Medicine

## 2020-12-31 NOTE — Telephone Encounter (Signed)
Unable to leave VM [place] Dec

## 2021-01-04 ENCOUNTER — Ambulatory Visit (HOSPITAL_COMMUNITY): Admission: RE | Admit: 2021-01-04 | Payer: Self-pay | Source: Ambulatory Visit

## 2021-01-04 ENCOUNTER — Encounter (HOSPITAL_COMMUNITY): Payer: Self-pay

## 2021-01-13 ENCOUNTER — Telehealth (HOSPITAL_BASED_OUTPATIENT_CLINIC_OR_DEPARTMENT_OTHER): Payer: Self-pay | Admitting: Cardiovascular Disease

## 2021-01-13 NOTE — Telephone Encounter (Signed)
Left message for patient to call and discuss rescheduling the Cardiac MRI ordered by Dr. Duke Salvia

## 2021-01-18 ENCOUNTER — Encounter (HOSPITAL_BASED_OUTPATIENT_CLINIC_OR_DEPARTMENT_OTHER): Payer: Self-pay | Admitting: Internal Medicine

## 2021-01-18 NOTE — Telephone Encounter (Signed)
Spoke with patient regarding the 02/21/21 9:00 am Cardiac MRI appointment at [facility]---arrival time is 8:30 am------1st floor admissions office for check in.  Will  mail information to patient and he voiced his understanding.

## 2021-02-16 ENCOUNTER — Encounter: Payer: Self-pay | Admitting: Cardiovascular Disease

## 2021-02-16 NOTE — Telephone Encounter (Signed)
error 

## 2021-02-18 ENCOUNTER — Telehealth (HOSPITAL_COMMUNITY): Payer: Self-pay | Admitting: *Deleted

## 2021-02-18 NOTE — Telephone Encounter (Signed)
Attempted to call patient regarding upcoming cardiac MRI appointment. No voicemail box to leave a message.  Larey Brick RN Navigator Cardiac Imaging [facility] [phone] Office [phone] Cell

## 2021-02-21 ENCOUNTER — Other Ambulatory Visit: Payer: Self-pay

## 2021-02-21 ENCOUNTER — Ambulatory Visit (HOSPITAL_COMMUNITY)
Admission: RE | Admit: 2021-02-21 | Discharge: 2021-02-21 | Disposition: A | Discharge status: Home / Self Care | Payer: Self-pay | Source: Ambulatory Visit | Attending: Internal Medicine | Admitting: Internal Medicine

## 2021-02-21 DIAGNOSIS — I5042 Chronic combined systolic (congestive) and diastolic (congestive) heart failure: Secondary | ICD-10-CM | POA: Insufficient documentation

## 2021-02-21 MED ORDER — GADOBUTROL 1 MMOL/ML IV SOLN
10.0000 mL | Freq: Once | INTRAVENOUS | Status: AC | PRN
  Administered 2021-02-21: 10 mL via INTRAVENOUS

## 2021-04-07 ENCOUNTER — Ambulatory Visit (HOSPITAL_BASED_OUTPATIENT_CLINIC_OR_DEPARTMENT_OTHER): Payer: Medicaid Other | Admitting: Cardiovascular Disease

## 2021-05-25 ENCOUNTER — Ambulatory Visit (HOSPITAL_BASED_OUTPATIENT_CLINIC_OR_DEPARTMENT_OTHER): Payer: Self-pay | Admitting: Cardiovascular Disease

## 2021-05-25 ENCOUNTER — Encounter (HOSPITAL_BASED_OUTPATIENT_CLINIC_OR_DEPARTMENT_OTHER): Payer: Self-pay

## 2021-05-25 NOTE — Progress Notes (Incomplete)
Cardiology Office Note  Date:  05/25/2021   ID:  Alex Lee, DOB 1976/07/06, MRN [id number]  PCP:  Patient, No Pcp Per (Inactive)  Cardiologist:   Carlena Bjornstad   No chief complaint on file.   History of Present Illness: Alex Lee is a 45 y.o. male chronic systolic diastolic heart failure (LVEF recovered), hypertension, asthma, tobacco abuse, and hyperlipidemia who presents for follow-up.  He was initially seen 12/2019 in the hospital with acute onset systolic dysfunction.  He presented with two days of shortness of breath. LVEF was 20 to 25%.  He underwent coronary CT-a which had a calcium score of 0 and no evidence of coronary artery disease.  He reported drinking 2-3 drinks daily at the time.  He was discharged on carvedilol and Entresto.  Entresto was increased 01/2020.  Spironolactone and Jardiance were added to his regimen due to poorly controlled blood pressures.  He had a follow-up echocardiogram 03/2020 that revealed LVEF 25 to 30% with global hypokinesis and grade 2 diastolic dysfunction.  Right ventricular function was normal.  He was seen in the advanced HF clinic and carvedilol was increased 09/2020.  LVEF in clinic on 09/2020 was 55%.  He had a Cardiac MRI 02/2021 with LVEF 45% and vasal septal hypokinesis. There was a small area of subendocardial LGE in the vasal inferoseptal wall. This raised suspicion for cardiac sarcoidosis. He was discharged from HF clinic. He had renal artery dopplers 04/2020 that showed normal blood flow bilaterally.  Today,  He denies any palpitations, chest pain, or shortness of breath. No lightheadedness, headaches, syncope, orthopnea, PND, lower extremity edema or exertional symptoms.  (+)   Past Medical History:  Diagnosis Date   CHF (congestive heart failure) (HCC)    Childhood asthma    Essential hypertension 03/03/2020   Snoring 03/03/2020   Tobacco abuse    Tobacco abuse 04/08/2020    No past surgical history on file.   Current  Outpatient Medications  Medication Sig Dispense Refill   albuterol (VENTOLIN HFA) 108 (90 Base) MCG/ACT inhaler Inhale 2 puffs into the lungs every 6 (six) hours as needed for wheezing or shortness of breath. 18 g 0   carvedilol (COREG) 12.5 MG tablet Take 1.5 tablets (18.75 mg total) by mouth 2 (two) times daily with a meal. 60 tablet 6   empagliflozin (JARDIANCE) 10 MG TABS tablet Take 1 tablet (10 mg total) by mouth daily before breakfast. 90 tablet 3   sacubitril-valsartan (ENTRESTO) 97-103 MG Take 1 tablet by mouth 2 (two) times daily. 180 tablet 3   spironolactone (ALDACTONE) 25 MG tablet Take 1 tablet (25 mg total) by mouth daily. 90 tablet 3   No current facility-administered medications for this visit.    Allergies:   Patient has no known allergies.    Social History:  The patient  reports that he has been smoking. He has been smoking an average of .5 packs per day. He has never used smokeless tobacco. He reports current alcohol use of about 10.0 standard drinks per week. He reports that he does not currently use drugs.   Family History:  The patient's family history includes Hypertension in his father and mother.    ROS:  Please see the history of present illness.  All other systems are reviewed and negative.    PHYSICAL EXAM: VS:  There were no vitals taken for this visit. , BMI There is no height or weight on file to calculate BMI. GENERAL:  Well appearing HEENT:  Pupils equal round and reactive, fundi not visualized, oral mucosa unremarkable NECK:  No jugular venous distention, waveform within normal limits, carotid upstroke brisk and symmetric, no bruits LUNGS:  Clear to auscultation bilaterally HEART:  RRR.  PMI not displaced or sustained,S1 and S2 within normal limits, no S3, no S4, no clicks, no rubs, no murmurs ABD:  Flat, positive bowel sounds normal in frequency in pitch, no bruits, no rebound, no guarding, no midline pulsatile mass, no hepatomegaly, no  splenomegaly EXT:  2 plus pulses throughout, no edema, no cyanosis no clubbing SKIN:  No rashes no nodules NEURO:  Cranial nerves II through XII grossly intact, motor grossly intact throughout PSYCH:  Cognitively intact, oriented to person place and time   EKG:   05/25/2021: Sinus ***. Rate *** bpm.  Cardiac MRI 02/21/2021: FINDINGS: Limited images of the lung fields with no gross abnormalities.   Mildly dilated left ventricular size with mild LV hypertrophy. There is basal septal hypokinesis, overall LV EF 45%. Normal right ventricular size and systolic function, EF 52%. Mildly dilated left atrium, normal right atrium. Trileaflet aortic valve with trivial aortic insufficiency, no aortic stenosis. No mitral regurgitation.   On delayed enhancement imaging, there is a very small subendocardial well-circumscribed area of late gadolinium enhancement (LGE) in the basal inferoseptal wall.   Measurements:   LVEDV 229 mL LVSV 103 mL LVEF 45%   RVEDV 185 mL RVSV 97 mL RVEF 52%   IMPRESSION: 1. Mildly dilated LV with mild LV hypertrophy. Basal septal hypokinesis with EF 45%.   2.  Normal RV size and systolic function, EF 52%.   3. Very small, well-circumscribed area of subendocardial LGE in the basal inferoseptal wall. This is not suggestive of coronary disease. Can see nodular-type LGE in the basal septal wall in cardiac sarcoidosis though the lesion seen is very small.  Bilateral Renal Artery Duplex 04/13/2020: Summary:  Largest Aortic Diameter: 2.1 cm     Renal:  Right: Normal size right kidney. Normal right Resisitive Index.         Normal cortical thickness of right kidney. No evidence of         right renal artery stenosis. RRV flow present.  Left:  Normal size of left kidney. Normal left Resistive Index.         Normal cortical thickness of the left kidney. No evidence of         left renal artery stenosis. LRV flow present.  Mesenteric:  Normal Celiac artery and  Superior Mesenteric artery findings.   Echo 03/31/2020:  1. Left ventricular ejection fraction, by estimation, is 25 to 30%. The  left ventricle has severely decreased function. The left ventricle  demonstrates global hypokinesis. Left ventricular diastolic parameters are  consistent with Grade II diastolic  dysfunction (pseudonormalization).   2. Right ventricular systolic function is normal. The right ventricular  size is normal. Tricuspid regurgitation signal is inadequate for assessing  PA pressure.   3. Left atrial size was mildly dilated.   4. The mitral valve is normal in structure. No evidence of mitral valve  regurgitation. No evidence of mitral stenosis.   5. The aortic valve is tricuspid. Aortic valve regurgitation is trivial.  No aortic stenosis is present.   6. The inferior vena cava is normal in size with greater than 50%  respiratory variability, suggesting right atrial pressure of 3 mmHg.    Echocardiogram 12/28/2019:  1. Severely reduced LVEF 20-25% with diffuse hypokinesis, mildly dilated  left ventricle and  left atrium, grade 2 diastolic dysfunction with  elevated filling pressures. RVEF appears normal.   2. Left ventricular ejection fraction, by estimation, is 20 to 25%. The  left ventricle has severely decreased function. The left ventricle  demonstrates global hypokinesis. The left ventricular internal cavity size  was mildly dilated. Left ventricular  diastolic parameters are consistent with Grade II diastolic dysfunction  (pseudonormalization). Elevated left ventricular end-diastolic pressure.   3. Right ventricular systolic function is normal. The right ventricular  size is normal. There is normal pulmonary artery systolic pressure.   4. Left atrial size was mildly dilated.   5. The mitral valve is normal in structure. No evidence of mitral valve  regurgitation. No evidence of mitral stenosis.   6. The aortic valve is normal in structure. Aortic valve  regurgitation is  mild. Mild to moderate aortic valve sclerosis/calcification is present,  without any evidence of aortic stenosis.   7. The inferior vena cava is normal in size with greater than 50%  respiratory variability, suggesting right atrial pressure of 3 mmHg.   Coronary CTA 12/29/2019 1. Coronary calcium score of 0. This was 0 percentile for age and sex matched control.  2. Normal coronary origin with right dominance. 3. No evidence of CAD.   Recent Labs: 09/09/2020: B Natriuretic Peptide 13.6; BUN 19; Creatinine, Ser 1.13; Potassium 4.1; Sodium 138    Lipid Panel    Component Value Date/Time   CHOL 191 12/29/2019 0417   TRIG 84 12/29/2019 0417   HDL 33 (L) 12/29/2019 0417   CHOLHDL 5.8 12/29/2019 0417   VLDL 17 12/29/2019 0417   LDLCALC 141 (H) 12/29/2019 0417      Wt Readings from Last 3 Encounters:  10/13/20 198 lb 12.8 oz (90.2 kg)  09/09/20 197 lb 3.2 oz (89.4 kg)  07/08/20 199 lb 6.4 oz (90.4 kg)      ASSESSMENT AND PLAN:  No problem-specific Assessment & Plan notes found for this encounter.  # Chronic systolic and diastolic heart failure: Non-ischemic cardiomyopathy.  LVEF 20 to 25% and improved mildly to 25 to 30%.  He was referred to advanced heart failure clinic but when he went there bedside echo showed that his LVEF had improved to 55%.  MRI is pending.  We will make sure he gets this and his Entresto refilled.  Continue Entresto, carvedilol, spironolactone, and Jardiance.  No indication for ICD at this time.    # Essential hypertension: Blood pressure is poorly controlled as above.  Increasing carvedilol and Entresto.  We will also get renal artery Dopplers.  Sleep study is pending.  TSH was normal 12/2019.  # Snoring: # Daytime somnolence: Sleep study not performed.  # Tobacco abuse:  Encouraged him to keep cutting back.   Current medicines are reviewed at length with the patient today.  The patient does not have concerns regarding  medicines.  The following changes have been made:  Increase Entresto and carvedilol.  Start [place].   Labs/ tests ordered today include:   No orders of the defined types were placed in this encounter.    Disposition:   FU with Tiffany C. Oval Linsey, MD, [facility] in ***6 months.    I,Mathew Stumpf,acting as a Education administrator for Skeet Latch, MD.,have documented all relevant documentation on the behalf of Skeet Latch, MD,as directed by  Skeet Latch, MD while in the presence of Skeet Latch, MD.  ***  Signed, Tiffany C. Oval Linsey, MD, [facility]  05/25/2021 8:35 AM    [place] °

## 2021-05-26 ENCOUNTER — Telehealth: Payer: Self-pay | Admitting: Cardiovascular Disease

## 2021-05-26 NOTE — Telephone Encounter (Signed)
Spoke with pt and obtained permission to speak with wife.  Wife calling to get number for [facility] [employer] so she can reach out about getting a refill.  Provided her with number.  Wife appreciative for call.

## 2021-05-26 NOTE — Telephone Encounter (Signed)
Alex Lee's wife is calling requesting the number of the company that sends Alex Lee's medication to him for free.

## 2021-06-08 ENCOUNTER — Encounter (HOSPITAL_BASED_OUTPATIENT_CLINIC_OR_DEPARTMENT_OTHER): Payer: Self-pay | Admitting: Cardiovascular Disease

## 2021-06-08 ENCOUNTER — Encounter (HOSPITAL_BASED_OUTPATIENT_CLINIC_OR_DEPARTMENT_OTHER): Payer: Self-pay | Admitting: *Deleted

## 2021-06-08 ENCOUNTER — Other Ambulatory Visit: Payer: Self-pay

## 2021-06-08 ENCOUNTER — Ambulatory Visit (INDEPENDENT_AMBULATORY_CARE_PROVIDER_SITE_OTHER): Payer: Self-pay | Admitting: Cardiovascular Disease

## 2021-06-08 VITALS — BP 142/84 | HR 64 | Ht 67.0 in | Wt 198.4 lb

## 2021-06-08 DIAGNOSIS — Z5181 Encounter for therapeutic drug level monitoring: Secondary | ICD-10-CM

## 2021-06-08 DIAGNOSIS — Z720 Tobacco use: Secondary | ICD-10-CM

## 2021-06-08 DIAGNOSIS — I10 Essential (primary) hypertension: Secondary | ICD-10-CM

## 2021-06-08 DIAGNOSIS — I5042 Chronic combined systolic (congestive) and diastolic (congestive) heart failure: Secondary | ICD-10-CM

## 2021-06-08 MED ORDER — SPIRONOLACTONE 25 MG PO TABS: 25.0000 mg | ORAL_TABLET | Freq: Every day | ORAL | 3 refills | Status: DC

## 2021-06-08 MED ORDER — CARVEDILOL 6.25 MG PO TABS: 6.2500 mg | ORAL_TABLET | Freq: Two times a day (BID) | ORAL | 3 refills | Status: DC

## 2021-06-08 MED ORDER — CARVEDILOL 6.25 MG PO TABS
6.2500 mg | ORAL_TABLET | Freq: Two times a day (BID) | ORAL | 3 refills | Status: DC
Start: 2021-06-08 — End: 2021-08-05

## 2021-06-08 NOTE — Assessment & Plan Note (Addendum)
LVEF 40 to 45% on cardiac MRI.  He has nonischemic cardiomyopathy with normal coronaries on cardiac CTA.  He is doing well clinically and has not had any shortness of breath, lower extremity edema, orthopnea, or PND.  His cardiac MRI did show a small area of subendocardial LGE in the basal inferoseptal wall.  He notes that he has not been taking his carvedilol or spironolactone since the time of the cardiac MRI.  He does still take Entresto.  He has had issues with insurance and been unable to afford his medications.  We will send carvedilol 6.25 mg twice daily and spironolactone 25 mg daily to the pharmacy.  Blood pressure is only mildly elevated today on only Entresto.  There is notation of a small lymph node noted on chest CT but no obvious mediastinal lymphadenopathy that raises suspicion for sarcoidosis.  We will have him follow back up with Dr. Gala Romney in the heart failure clinic to better work-up whether or not this could be infiltrative cardiomyopathy due to sarcoidosis.  Expressed the importance of taking all his heart failure medications as prescribed.

## 2021-06-08 NOTE — Patient Instructions (Addendum)
Medication Instructions:  START SPIRONOLACTONE 25 MG DAILY   START CARVEDILOL 6.25 MG TWICE A DAY   Labwork: BMET IN 1 WEEK   Testing/Procedures: NONE  Follow-Up: AS NEEDED   You have been referred to HEART FAILURE CLINIC   IF YOU DO NOT HEAR FROM THEM IN 2 WEEKS CALL THE OFFICE TO FOLLOW UP

## 2021-06-08 NOTE — Progress Notes (Signed)
Cardiology Office Note  Date:  06/08/2021   ID:  Alex Lee, DOB [birth year], MRN [id number]  PCP:  Patient, No Pcp Per (Inactive)  Cardiologist:   Alex Si, MD   No chief complaint on file.   History of Present Illness: Alex Lee is a 45 y.o. male chronic systolic diastolic heart failure (LVEF recovered), hypertension, asthma, tobacco abuse, and hyperlipidemia who presents for follow-up.  He was initially seen 12/2019 in the hospital with acute onset systolic dysfunction.  He presented with two days of shortness of breath.LVEF was 20 to 25%.  He underwent coronary CT-a which had a calcium score of 0 and no evidence of coronary artery disease.  He reported drinking 2-3 drinks daily at the time.  He was discharged on carvedilol and Entresto.  Entresto was increased 01/2020.  Spironolactone and Jardiance were added to his regimen due to poorly controlled blood pressures.  He had a follow-up echocardiogram 03/2020 that revealed LVEF 25 to 30% with global hypokinesis and grade 2 diastolic dysfunction.  Right ventricular function was normal.  He was seen in the advanced HF clinic and carvedilol was increased 09/2020.  LVEF in clinic on 09/2020 was 55%.  He was discharged from HF clinic.  At his last visit, he was feeling well with no major complaints. He had not yet completed his cardiac MRI at this time.   He underwent cardiac MRI on 02/2021 which showed a mildly dilated LV and LVEF 45%. There was a region of subendocardial LGE in the basal inferoseptal wall. There was some concern for cardiac sarcoidosis, though the region was very small.  Today, he is accompanied by his wife and is doing well. He does not have any major concerns. His insurance ran out and he was unable to refill his spironolactone and carvedilol. His wife helps him record his blood pressure at home and she reports measurements that are slightly high but normal for him. He does not formally exercise. However, he stays active at  work. He and his wife travel between here and [place], [place] to care for his mother who is in a wheelchair. He denies any palpitations, chest pain, or shortness of breath, lightheadedness, headaches, syncope, orthopnea, PND, lower extremity edema or exertional symptoms.  Past Medical History:  Diagnosis Date   CHF (congestive heart failure) (HCC)    Childhood asthma    Essential hypertension 03/03/2020   Snoring 03/03/2020   Tobacco abuse    Tobacco abuse 04/08/2020    No past surgical history on file.   Current Outpatient Medications  Medication Sig Dispense Refill   albuterol (VENTOLIN HFA) 108 (90 Base) MCG/ACT inhaler Inhale 2 puffs into the lungs every 6 (six) hours as needed for wheezing or shortness of breath. 18 g 0   empagliflozin (JARDIANCE) 10 MG TABS tablet Take 1 tablet (10 mg total) by mouth daily before breakfast. 90 tablet 3   sacubitril-valsartan (ENTRESTO) 97-103 MG Take 1 tablet by mouth 2 (two) times daily. 180 tablet 3   carvedilol (COREG) 6.25 MG tablet Take 1 tablet (6.25 mg total) by mouth 2 (two) times daily with a meal. 180 tablet 3   spironolactone (ALDACTONE) 25 MG tablet Take 1 tablet (25 mg total) by mouth daily. 90 tablet 3   No current facility-administered medications for this visit.    Allergies:   Patient has no known allergies.    Social History:  The patient  reports that he has been smoking cigarettes. He has been smoking an  average of .5 packs per day. He has never used smokeless tobacco. He reports current alcohol use of about 10.0 standard drinks per week. He reports that he does not currently use drugs.   Family History:  The patient's family history includes Hypertension in his father and mother.   ROS:  Please see the history of present illness.    All other systems are reviewed and negative.   PHYSICAL EXAM: VS:  BP (!) 142/84 (BP Location: Right Arm, Patient Position: Sitting, Cuff Size: Large)   Pulse 64   Ht 5\' 7"  (1.702 m)   Wt 198 lb  6.4 oz (90 kg)   SpO2 97%   BMI 31.07 kg/m  , BMI Body mass index is 31.07 kg/m. GENERAL:  Well appearing HEENT:  Pupils equal round and reactive, fundi not visualized, oral mucosa unremarkable NECK:  No jugular venous distention, waveform within normal limits, carotid upstroke brisk and symmetric, no bruits LUNGS:  Clear to auscultation bilaterally HEART:  RRR.  PMI not displaced or sustained,S1 and S2 within normal limits, no S3, no S4, no clicks, no rubs, no murmurs ABD:  Flat, positive bowel sounds normal in frequency in pitch, no bruits, no rebound, no guarding, no midline pulsatile mass, no hepatomegaly, no splenomegaly EXT:  2 plus pulses throughout, no edema, no cyanosis no clubbing SKIN:  No rashes no nodules NEURO:  Cranial nerves II through XII grossly intact, motor grossly intact throughout PSYCH:  Cognitively intact, oriented to person place and time  EKG:   06/08/21: Sinus rhythm, rate 71 bpm; LAFB and LVH  Cardiac MRI 02/21/21 1. Mildly dilated LV with mild LV hypertrophy. Basal septal hypokinesis with EF 45%. 2.  Normal RV size and systolic function, EF 123456. 3. Very small, well-circumscribed area of subendocardial LGE in the basal inferoseptal wall. This is not suggestive of coronary disease. Can see nodular-type LGE in the basal septal wall in cardiac sarcoidosis though the lesion seen is very small.  Echo 03/2020:  1. Left ventricular ejection fraction, by estimation, is 25 to 30%. The  left ventricle has severely decreased function. The left ventricle  demonstrates global hypokinesis. Left ventricular diastolic parameters are  consistent with Grade II diastolic  dysfunction (pseudonormalization).   2. Right ventricular systolic function is normal. The right ventricular  size is normal. Tricuspid regurgitation signal is inadequate for assessing  PA pressure.   3. Left atrial size was mildly dilated.   4. The mitral valve is normal in structure. No evidence of  mitral valve  regurgitation. No evidence of mitral stenosis.   5. The aortic valve is tricuspid. Aortic valve regurgitation is trivial.  No aortic stenosis is present.   6. The inferior vena cava is normal in size with greater than 50%  respiratory variability, suggesting right atrial pressure of 3 mmHg.  CT Coronary 12/29/19 1. Coronary calcium score of 0. This was 0 percentile for age and sex matched control. 2. Normal coronary origin with right dominance. 3. No evidence of CAD.   Echocardiogram 12/28/2019:  1. Severely reduced LVEF 20-25% with diffuse hypokinesis, mildly dilated  left ventricle and left atrium, grade 2 diastolic dysfunction with  elevated filling pressures. RVEF appears normal.   2. Left ventricular ejection fraction, by estimation, is 20 to 25%. The  left ventricle has severely decreased function. The left ventricle  demonstrates global hypokinesis. The left ventricular internal cavity size  was mildly dilated. Left ventricular  diastolic parameters are consistent with Grade II diastolic dysfunction  (  pseudonormalization). Elevated left ventricular end-diastolic pressure.   3. Right ventricular systolic function is normal. The right ventricular  size is normal. There is normal pulmonary artery systolic pressure.   4. Left atrial size was mildly dilated.   5. The mitral valve is normal in structure. No evidence of mitral valve  regurgitation. No evidence of mitral stenosis.   6. The aortic valve is normal in structure. Aortic valve regurgitation is  mild. Mild to moderate aortic valve sclerosis/calcification is present,  without any evidence of aortic stenosis.   7. The inferior vena cava is normal in size with greater than 50%  respiratory variability, suggesting right atrial pressure of 3 mmHg.   Coronary CTA 12/29/2019 1. Coronary calcium score of 0. This was 0 percentile for age and sex matched control.  2. Normal coronary origin with right dominance. 3. No  evidence of CAD.   Recent Labs: 09/09/2020: B Natriuretic Peptide 13.6; BUN 19; Creatinine, Ser 1.13; Potassium 4.1; Sodium 138    Lipid Panel    Component Value Date/Time   CHOL 191 12/29/2019 0417   TRIG 84 12/29/2019 0417   HDL 33 (L) 12/29/2019 0417   CHOLHDL 5.8 12/29/2019 0417   VLDL 17 12/29/2019 0417   LDLCALC 141 (H) 12/29/2019 0417      Wt Readings from Last 3 Encounters:  06/08/21 198 lb 6.4 oz (90 kg)  10/13/20 198 lb 12.8 oz (90.2 kg)  09/09/20 197 lb 3.2 oz (89.4 kg)      ASSESSMENT AND PLAN: Chronic combined systolic and diastolic heart failure (HCC) LVEF 40 to 45% on cardiac MRI.  He has nonischemic cardiomyopathy with normal coronaries on cardiac CTA.  He is doing well clinically and has not had any shortness of breath, lower extremity edema, orthopnea, or PND.  His cardiac MRI did show a small area of subendocardial LGE in the basal inferoseptal wall.  He notes that he has not been taking his carvedilol or spironolactone since the time of the cardiac MRI.  He does still take Entresto.  He has had issues with insurance and been unable to afford his medications.  We will send carvedilol 6.25 mg twice daily and spironolactone 25 mg daily to the pharmacy.  Blood pressure is only mildly elevated today on only Entresto.  There is notation of a small lymph node noted on chest CT but no obvious mediastinal lymphadenopathy that raises suspicion for sarcoidosis.  We will have him follow back up with Dr. Gala Romney in the heart failure clinic to better work-up whether or not this could be infiltrative cardiomyopathy due to sarcoidosis.  Expressed the importance of taking all his heart failure medications as prescribed.  Essential hypertension Blood pressure mildly above goal, but he has not been taking spironolactone or carvedilol.  Will resume and continue Entresto.  Tobacco abuse Cessation advised.    Current medicines are reviewed at length with the patient today.  The  patient does not have concerns regarding medicines.  The following changes have been made:  Increase Entresto and carvedilol.  Start [place].   Labs/ tests ordered today include:   Orders Placed This Encounter  Procedures   Basic metabolic panel   AMB referral to CHF clinic   EKG 12-Lead     Disposition:   FU with Alex Biscoe C. Duke Salvia, MD, Alex Lee as needed  Alex Lee as a scribe for Alex Si, MD.,have documented all relevant documentation on the behalf of Alex Si, MD,as directed by  Alex Si, MD while in  the presence of Alex Latch, MD.  I, Alex Oval Linsey, MD have reviewed all documentation for this visit.  The documentation of the exam, diagnosis, procedures, and orders on 06/08/2021 are all accurate and complete.   Signed, Alex Lopata C. Oval Linsey, MD, [facility]  06/08/2021 6:43 PM    [place]

## 2021-06-08 NOTE — Assessment & Plan Note (Signed)
Cessation advised. 

## 2021-06-08 NOTE — Assessment & Plan Note (Signed)
Blood pressure mildly above goal, but he has not been taking spironolactone or carvedilol.  Will resume and continue Entresto.

## 2021-08-05 ENCOUNTER — Other Ambulatory Visit: Payer: Self-pay

## 2021-08-05 ENCOUNTER — Encounter (HOSPITAL_COMMUNITY): Payer: Self-pay | Admitting: Internal Medicine

## 2021-08-05 ENCOUNTER — Ambulatory Visit (HOSPITAL_COMMUNITY)
Admission: RE | Admit: 2021-08-05 | Discharge: 2021-08-05 | Disposition: A | Discharge status: Home / Self Care | Payer: Self-pay | Source: Ambulatory Visit | Attending: Family Medicine | Admitting: Family Medicine

## 2021-08-05 VITALS — BP 124/88 | HR 84 | Wt 197.4 lb

## 2021-08-05 DIAGNOSIS — I10 Essential (primary) hypertension: Secondary | ICD-10-CM

## 2021-08-05 DIAGNOSIS — F17210 Nicotine dependence, cigarettes, uncomplicated: Secondary | ICD-10-CM | POA: Insufficient documentation

## 2021-08-05 DIAGNOSIS — I110 Hypertensive heart disease with heart failure: Secondary | ICD-10-CM | POA: Insufficient documentation

## 2021-08-05 DIAGNOSIS — I5042 Chronic combined systolic (congestive) and diastolic (congestive) heart failure: Secondary | ICD-10-CM

## 2021-08-05 DIAGNOSIS — I428 Other cardiomyopathies: Secondary | ICD-10-CM

## 2021-08-05 DIAGNOSIS — Z79899 Other long term (current) drug therapy: Secondary | ICD-10-CM | POA: Insufficient documentation

## 2021-08-05 DIAGNOSIS — I5022 Chronic systolic (congestive) heart failure: Secondary | ICD-10-CM | POA: Insufficient documentation

## 2021-08-05 LAB — BASIC METABOLIC PANEL
Anion gap: 7 (ref 5–15)
BUN: 11 mg/dL (ref 6–20)
CO2: 25 mmol/L (ref 22–32)
Calcium: 9 mg/dL (ref 8.9–10.3)
Chloride: 108 mmol/L (ref 98–111)
Creatinine, Ser: 1 mg/dL (ref 0.61–1.24)
GFR, Estimated: >60 mL/min (ref >60)
Glucose, Bld: 95 mg/dL (ref 70–99)
Potassium: 4 mmol/L (ref 3.5–5.1)
Sodium: 140 mmol/L (ref 135–145)

## 2021-08-05 MED ORDER — EMPAGLIFLOZIN 10 MG PO TABS: 10.0000 mg | ORAL_TABLET | Freq: Every day | ORAL | 4 refills | Status: DC

## 2021-08-05 MED ORDER — CARVEDILOL 6.25 MG PO TABS: 9.3750 mg | ORAL_TABLET | Freq: Two times a day (BID) | ORAL | 3 refills | Status: DC

## 2021-08-05 NOTE — Patient Instructions (Signed)
RESTART Jardiance 10 mg daily  INCREASE  Coreg to 9.375 mg Twice daily( 1 1/2 tablets)  Labs done today, your results will be available in MyChart, we will contact you for abnormal readings.  Your physician has requested that you have a cardiac MRI. Cardiac MRI uses a computer to create images of your heart as its beating, producing both still and moving pictures of your heart and major blood vessels. For further information please visit [email or web address] Please follow the instruction sheet given to you today for more information. Call office when insurance is obtained  Your physician recommends that you schedule a follow-up appointment in: 4 months  If you have any questions or concerns before your next appointment please send [place] a message through G[place]ela or call our office at [phone].    TO LEAVE A MESSAGE FOR THE NURSE SELECT OPTION 2, PLEASE LEAVE A MESSAGE INCLUDING: YOUR NAME DATE OF BIRTH CALL BACK NUMBER REASON FOR CALL**this is important as we prioritize the call backs  YOU WILL RECEIVE A CALL BACK THE SAME DAY AS LONG AS YOU CALL BEFORE 4:00 PM  At the [facility], you and your health needs are our priority. As part of our continuing mission to provide you with exceptional heart care, we have created designated Provider Care Teams. These Care Teams include your primary Cardiologist (physician) and Advanced Practice Providers (APPs- Physician Assistants and Nurse Practitioners) who all work together to provide you with the care you need, when you need it.   You may see any of the following providers on your designated Care Team at your next follow up: Dr Glori Bickers Dr Haynes Kerns, NP Lyda Jester, [facility] [place], [place] Audry Riles, PharmD   Please be sure to bring in all your medications bottles to every appointment.   Do the following things EVERYDAY: Weigh yourself in the morning before breakfast. Write  it down and keep it in a log. Take your medicines as prescribed Eat low salt foods--Limit salt (sodium) to 2000 mg per day.  Stay as active as you can everyday Limit all fluids for the day to less than 2 liters

## 2021-08-05 NOTE — Progress Notes (Addendum)
ADVANCED HF CLINIC CONSULT NOTE  Referring Physician: Skeet Latch, MD Primary Care: Patient, No Pcp Per (Inactive) Primary Cardiologist: Skeet Latch, MD  HPI:  Alex Lee is a 46 y.o.. 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  He presented in June 2021 with acute HF.  Echo EF 25-30% with G2DD.  Coronary CTA was negative for coronary disease and had a calcium score of 0. Negative sleep study 11/21. He saw Dr. Oval Linsey in September 2021.  Echo 09/21 showed no improvement in his EF.   Last seen for follow-up 03/22. EF 55% on bedside echo. cMRI 08/22 with LVEF 45%, RVEF 52%, versy small area of subendocardial LGE in basal inferoseptal wall (can be seen in sarcoidosis but very small).   Saw Dr. Oval Linsey, 11/22. Off all meds. Restarted carvedilol and spironolactone. Referred back to [facility] to assess whether or not further workup needed to r/o sarcoidosis.  Today he returns for HF follow up with his wife. Overall feeling fine. 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 Denies palpitations, abnormal bleeding, CP, dizziness, edema, or PND/Orthopnea. Appetite ok. No fever or chills.Taking all medications. Has been off Jardiance.   No significant FHx HF   Cardiac Studies: - Echo (6/21): EF 25-30% G2DD - CTA (6/21): negative for CAD - Echo (9/21): EF 25-30% - Bedside echo (3/22) in clinic EF 55% (EF fully recovered)  - cMRI (02/21/21): EF 45% RVEF 52% small area of LGE in basilar inferoseptal wall   Past Medical History:  Diagnosis Date   CHF (congestive heart failure) (HCC)    Childhood asthma    Essential hypertension 03/03/2020   Snoring 03/03/2020   Tobacco abuse    Tobacco abuse 04/08/2020   Current Outpatient Medications  Medication Sig Dispense Refill   albuterol (VENTOLIN HFA) 108 (90 Base) MCG/ACT inhaler Inhale 2 puffs into the lungs every 6 (six) hours as needed for wheezing or shortness of breath. 18 g 0   carvedilol  (COREG) 6.25 MG tablet Take 1 tablet (6.25 mg total) by mouth 2 (two) times daily with a meal. 180 tablet 3   sacubitril-valsartan (ENTRESTO) 97-103 MG Take 1 tablet by mouth 2 (two) times daily. 180 tablet 3   spironolactone (ALDACTONE) 25 MG tablet Take 1 tablet (25 mg total) by mouth daily. 90 tablet 3   No current facility-administered medications for this encounter.    No Known Allergies    Social History   Socioeconomic History   Marital status: Married    Spouse name: Not on file   Number of children: Not on file   Years of education: Not on file   Highest education level: Not on file  Occupational History   Not on file  Tobacco Use   Smoking status: Every Day    Packs/day: 0.50    Types: Cigarettes   Smokeless tobacco: Never  Substance and Sexual Activity   Alcohol use: Yes    Alcohol/week: 10.0 standard drinks    Types: 10 Cans of beer per week   Drug use: Not Currently   Sexual activity: Not on file  Other Topics Concern   Not on file  Social History Narrative   Not on file   Social Determinants of Health   Financial Resource Strain: Not on file  Food Insecurity: Not on file  Transportation Needs: Not on file  Physical Activity: Not on file  Stress: Not on file  Social Connections: Not on file  Intimate Partner Violence: Not on file   Family History  Problem Relation Age of Onset   Hypertension Mother    Hypertension Father    Wt Readings from Last 3 Encounters:  08/05/21 89.5 kg (197 lb 6.4 oz)  06/08/21 90 kg (198 lb 6.4 oz)  10/13/20 90.2 kg (198 lb 12.8 oz)   BP 124/88    Pulse 84    Wt 89.5 kg (197 lb 6.4 oz)    SpO2 97%    BMI 30.92 kg/m   PHYSICAL EXAM: General:  NAD. No resp difficulty HEENT: Normal Neck: Supple. No JVD. Carotids 2+ bilat; no bruits. No lymphadenopathy or thryomegaly appreciated. Cor: PMI nondisplaced. Regular rate & rhythm. No rubs, gallops or murmurs. Lungs: Clear Abdomen: Soft, nontender, nondistended. No  hepatosplenomegaly. No bruits or masses. Good bowel sounds. Extremities: No cyanosis, clubbing, rash, edema Neuro: Alert & oriented x 3, cranial nerves grossly intact. Moves all 4 extremities w/o difficulty. Affect pleasant.  ASSESSMENT & PLAN: 1. Chronic systolic HF due to NICM - possible HTN CM - Echo 6/21 EF 25-30% G2DD RV ok - CTA 6/21 no CAD - Echo 9/21 EF 25-30% -  There is notation of a small lymph node noted on chest CT but no obvious mediastinal lymphadenopathy that raises suspicion for sarcoidosis.   - Bedside echo 3/22 in clinic EF 55% (EF fully recovered)  - cMRI 02/21/21 EF 45% RVEF 52% small area of LGE in basilar inferoseptal wall  - Discussed with Dr. Haroldine Laws, small are in basilar inferoseptal wall likely not sarcoid, but will repeat cMRI in 4-6 months to follow. - NYHA I. Volume status ok  - Restart Jardiance 10 mg daily. - Continue Entresto 97/103 mg bid. - Increase carvedilol to 9/375 mg bid. - Continue spiro 25 mg daily. - Labs today.  2. Essential HTN - Increase carvedilol as above. - He does not snore.  3. Tobacco use - Encouraged cessation    Allena Katz, FNP-BC 3:50 PM  Patient seen and examined with the above-signed Advanced Practice Provider and/or Housestaff. I personally reviewed laboratory data, imaging studies and relevant notes. I independently examined the patient and formulated the important aspects of the plan. I have edited the note to reflect any of my changes or salient points. I have personally discussed the plan with the patient and/or family.  Overall doing well. Working [employer]. NYHA I. Volume status ok. EF improving.   cMRI reviewed. Very small area of LGE in non-coronary pattern.  CT chest no evidence of extra-cardiac sarcoid.  General:  Well appearing. No resp difficulty HEENT: normal Neck: supple. no JVD. Carotids 2+ bilat; no bruits. No lymphadenopathy or thryomegaly appreciated. Cor: PMI nondisplaced. Regular rate & rhythm. No  rubs, gallops or murmurs. Lungs: clear Abdomen: soft, nontender, nondistended. No hepatosplenomegaly. No bruits or masses. Good bowel sounds. Extremities: no cyanosis, clubbing, rash, edema Neuro: alert & orientedx3, cranial nerves grossly intact. moves all 4 extremities w/o difficulty. Affect pleasant  He is doing quite well. cMRI reviewed and strongly doubt that LGE represents cardiac sarcoid. For completeness sake, will repeat cMRI in 6 months to re-evaluate EF and see if LGE patterns changes at all. I do not think he needs cardiac PET currently. Agree with med changes as above. PharmD and SW staff have seen as well to assist with meds.  Total time spent 35 minutes. Over half that time spent discussing above.    Glori Bickers, MD  10:12 PM

## 2021-08-05 NOTE — Progress Notes (Signed)
CSW consulted to discuss lack of insurance.  Pt back at work at this time and will have coverage starting in the next month or so- encouraged him to reach out to clinic once he obtains so they can schedule testing.  Provided pt with CAFA to help with current outstanding bills  No further needs at this time  Jorge Ny, [place] Worker [facility] Desk#: [phone] Cell#: [phone]

## 2021-08-05 NOTE — Progress Notes (Signed)
Medication Samples have been provided to the patient.  Drug name: Jardiance       Strength: 10 mg        Qty: 5  LOT: [id number]  Exp.Date: 1/25  Dosing instructions: take 1 tab Daily  The patient has been instructed regarding the correct time, dose, and frequency of taking this medication, including desired effects and most common side effects.   Alex Lee 4:22 PM 08/05/2021

## 2021-09-15 ENCOUNTER — Telehealth (HOSPITAL_COMMUNITY): Payer: Self-pay | Admitting: *Deleted

## 2021-09-15 ENCOUNTER — Other Ambulatory Visit (HOSPITAL_COMMUNITY): Payer: Self-pay | Admitting: *Deleted

## 2021-09-15 DIAGNOSIS — Z01812 Encounter for preprocedural laboratory examination: Secondary | ICD-10-CM

## 2021-09-15 NOTE — Telephone Encounter (Signed)
Reaching out to patient to offer assistance regarding upcoming cardiac imaging study; pt verbalizes understanding of appt date/time, but he states he is unable to make his cardiac MRI appointment.  Informed him that I will have the scheduler contact him to make a new appointment and that he needs updated labs. He verbalized understanding to obtain labs prior to his new MRI appointment. ? ?Larey Brick RN Navigator Cardiac Imaging ?[facility] ?[phone] office ?[phone] cell ? ?

## 2021-09-19 ENCOUNTER — Ambulatory Visit (HOSPITAL_COMMUNITY): Admission: RE | Admit: 2021-09-19 | Payer: Self-pay | Source: Ambulatory Visit

## 2021-09-30 ENCOUNTER — Telehealth (HOSPITAL_BASED_OUTPATIENT_CLINIC_OR_DEPARTMENT_OTHER): Payer: Self-pay

## 2021-09-30 NOTE — Telephone Encounter (Signed)
New Message   Patient brought disability paperwork that needs to be filled out by MD.    Placed in MD box.

## 2021-10-03 ENCOUNTER — Encounter (HOSPITAL_BASED_OUTPATIENT_CLINIC_OR_DEPARTMENT_OTHER): Payer: Self-pay | Admitting: Cardiovascular Disease

## 2021-10-03 NOTE — Telephone Encounter (Signed)
Did you get forms on this patient?  ?

## 2021-10-13 NOTE — Telephone Encounter (Signed)
Dr Duke Salvia reviewed papers and actually would like the Heart Failure clinic to fill these out since you have seen them more recent. I reached out to the [employer] and she asked me to fax her the papers. I have faxed, confirmation received  ?

## 2021-10-21 ENCOUNTER — Telehealth (HOSPITAL_COMMUNITY): Payer: Self-pay | Admitting: Emergency Medicine

## 2021-10-21 NOTE — Telephone Encounter (Signed)
Reaching out to patient to offer assistance regarding upcoming cardiac imaging study; pt verbalizes understanding of appt date/time, parking situation and where to check in, pre-test NPO status and medications ordered, and verified current allergies; name and call back number provided for further questions should they arise ?Rockwell Alexandria RN Navigator Cardiac Imaging ?[facility] ?[phone] office ?[phone] cell ? ?Denies implants ?Denies claustro ?Arrival 130 ? ?

## 2021-10-24 ENCOUNTER — Ambulatory Visit (HOSPITAL_COMMUNITY): Payer: Self-pay

## 2021-11-04 NOTE — Telephone Encounter (Signed)
Hey which nurse did you ask to follow up on this?  ?

## 2021-11-16 ENCOUNTER — Telehealth (HOSPITAL_COMMUNITY): Payer: Self-pay | Admitting: *Deleted

## 2021-11-16 NOTE — Telephone Encounter (Signed)
Reaching out to patient to offer assistance regarding upcoming cardiac imaging study; pt verbalizes understanding of appt date/time, parking situation and where to check in, and verified current allergies; name and call back number provided for further questions should they arise ° °Muntaha Vermette RN Navigator Cardiac Imaging °[facility] °[phone] office °[phone] cell ° °Denies metal or claustrophobia. °

## 2021-11-18 ENCOUNTER — Ambulatory Visit (HOSPITAL_COMMUNITY)
Admission: RE | Admit: 2021-11-18 | Discharge: 2021-11-18 | Disposition: A | Discharge status: Home / Self Care | Payer: Self-pay | Source: Ambulatory Visit | Attending: Family Medicine | Admitting: Family Medicine

## 2021-11-18 DIAGNOSIS — I428 Other cardiomyopathies: Secondary | ICD-10-CM | POA: Insufficient documentation

## 2021-11-18 LAB — CBC
Hematocrit: 44.8 % (ref 37.5–51.0)
Hemoglobin: 15.4 g/dL (ref 13.0–17.7)
MCH: 32.9 pg (ref 26.6–33.0)
MCHC: 34.4 g/dL (ref 31.5–35.7)
MCV: 96 fL (ref 79–97)
Platelets: 245 10*3/uL (ref 150–450)
RBC: 4.68 x10E6/uL (ref 4.14–5.80)
RDW: 11.8 % (ref 11.6–15.4)
WBC: 6 10*3/uL (ref 3.4–10.8)

## 2021-11-18 MED ORDER — GADOBUTROL 1 MMOL/ML IV SOLN
10.0000 mL | Freq: Once | INTRAVENOUS | Status: AC | PRN
  Administered 2021-11-18: 10 mL via INTRAVENOUS

## 2021-11-28 ENCOUNTER — Telehealth (HOSPITAL_COMMUNITY): Payer: Self-pay | Admitting: *Deleted

## 2021-11-28 NOTE — Telephone Encounter (Signed)
Attempted to call patient and his wife re: MRI results. No answer and unable to leave message on either phone.   Hessie Diener RN Heart Failure [phone]

## 2021-11-28 NOTE — Telephone Encounter (Signed)
-----   Message from Rafael Bihari, [place] sent at 11/21/2021  4:05 PM EDT ----- LVEF 44%, RVEF 52%. Stable.  NICM, small area of LGE stable, un-changed from prio and non-specific. Does not suggest amyloidosis or sarcoidosis. Discussed with Dr. Haroldine Laws.

## 2021-12-05 NOTE — Progress Notes (Signed)
Patient did not show for appointment. Note left for templating purposes only.      ADVANCED HF CLINIC NOTE  Referring Physician: Chilton Si, MD Primary Care: Patient, No Pcp Per (Inactive) Primary Cardiologist: Chilton Si, MD  HPI:  Alex Lee is a 46 y.o.. male with HTN, tobacco use and systolic HF due to NICM.  Presented June 2021 with acute HF.  Echo EF 25-30% with G2DD.  Coronary CTA was negative for CAD; calcium score of 0. Negative sleep study 11/21. He saw Dr. Duke Salvia in September 2021.  Echo 09/21 showed no improvement in his EF.   Bedside echo 3/22 EF 55%. cMRI 08/22 with LVEF 45%, RVEF 52%, versy small area of subendocardial LGE in basal inferoseptal wall (can be seen in sarcoidosis but very small).   Saw Dr. Duke Salvia, 11/22. Off all meds. Restarted carvedilol and spironolactone. Referred back to [facility] to assess whether or not further workup needed to r/o sarcoidosis.  Today he returns for HF follow up with his wife. Overall feeling fine. 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 Denies palpitations, abnormal bleeding, CP, dizziness, edema, or PND/Orthopnea. Appetite ok. No fever or chills.Taking all medications. Has been off Jardiance.   No significant FHx HF   Cardiac Studies: - Echo (6/21): EF 25-30% G2DD - CTA (6/21): negative for CAD - Echo (9/21): EF 25-30% - Bedside echo (3/22) in clinic EF 55% (EF fully recovered)  - cMRI (02/21/21): EF 45% RVEF 52% small area of LGE in basilar inferoseptal wall   Past Medical History:  Diagnosis Date   CHF (congestive heart failure) (HCC)    Childhood asthma    Essential hypertension 03/03/2020   Snoring 03/03/2020   Tobacco abuse    Tobacco abuse 04/08/2020   Current Outpatient Medications  Medication Sig Dispense Refill   albuterol (VENTOLIN HFA) 108 (90 Base) MCG/ACT inhaler Inhale 2 puffs into the lungs every 6 (six) hours as needed for wheezing or shortness of breath.  18 g 0   carvedilol (COREG) 6.25 MG tablet Take 1.5 tablets (9.375 mg total) by mouth 2 (two) times daily with a meal. 180 tablet 3   empagliflozin (JARDIANCE) 10 MG TABS tablet Take 1 tablet (10 mg total) by mouth daily. 30 tablet 4   sacubitril-valsartan (ENTRESTO) 97-103 MG Take 1 tablet by mouth 2 (two) times daily. 180 tablet 3   spironolactone (ALDACTONE) 25 MG tablet Take 1 tablet (25 mg total) by mouth daily. 90 tablet 3   No current facility-administered medications for this encounter.    No Known Allergies    Social History   Socioeconomic History   Marital status: Married    Spouse name: Not on file   Number of children: Not on file   Years of education: Not on file   Highest education level: Not on file  Occupational History   Not on file  Tobacco Use   Smoking status: Every Day    Packs/day: 0.50    Types: Cigarettes   Smokeless tobacco: Never  Substance and Sexual Activity   Alcohol use: Yes    Alcohol/week: 10.0 standard drinks    Types: 10 Cans of beer per week   Drug use: Not Currently   Sexual activity: Not on file  Other Topics Concern   Not on file  Social History Narrative   Not on file   Social Determinants of Health   Financial Resource Strain: Not on file  Food Insecurity:  Not on file  Transportation Needs: Not on file  Physical Activity: Not on file  Stress: Not on file  Social Connections: Not on file  Intimate Partner Violence: Not on file   Family History  Problem Relation Age of Onset   Hypertension Mother    Hypertension Father    Wt Readings from Last 3 Encounters:  08/05/21 89.5 kg (197 lb 6.4 oz)  06/08/21 90 kg (198 lb 6.4 oz)  10/13/20 90.2 kg (198 lb 12.8 oz)   There were no vitals taken for this visit.  PHYSICAL EXAM: General:  NAD. No resp difficulty HEENT: Normal Neck: Supple. No JVD. Carotids 2+ bilat; no bruits. No lymphadenopathy or thryomegaly appreciated. Cor: PMI nondisplaced. Regular rate & rhythm. No rubs,  gallops or murmurs. Lungs: Clear Abdomen: Soft, nontender, nondistended. No hepatosplenomegaly. No bruits or masses. Good bowel sounds. Extremities: No cyanosis, clubbing, rash, edema Neuro: Alert & oriented x 3, cranial nerves grossly intact. Moves all 4 extremities w/o difficulty. Affect pleasant.  ASSESSMENT & PLAN: 1. Chronic systolic HF due to NICM - possible HTN CM - Echo 6/21 EF 25-30% G2DD RV ok - CTA 6/21 no CAD - Echo 9/21 EF 25-30% -  There is notation of a small lymph node noted on chest CT but no obvious mediastinal lymphadenopathy that raises suspicion for sarcoidosis.   - Bedside echo 3/22 in clinic EF 55% (EF fully recovered)  - cMRI 02/21/21 EF 45% RVEF 52% small area of LGE in basilar inferoseptal wall  - cMRI reviewed and strongly doubt that LGE represents cardiac sarcoid. For completeness sake, will repeat cMRI in 6 months to re-evaluate EF and see if LGE patterns changes at all. I do not think he needs cardiac PET currently. - NYHA I. Volume status ok  - Continue Jardiance 10 mg daily. - Continue Entresto 97/103 mg bid. - Continue carvedilol to 9.375 mg bid. - Continue spiro 25 mg daily. - Labs today.  2. Essential HTN - Blood pressure well controlled. Continue current regimen.  3. Tobacco use - Encouraged cessation    Arvilla Meres, MD  5:57 PM

## 2021-12-07 ENCOUNTER — Inpatient Hospital Stay (HOSPITAL_COMMUNITY)
Admission: RE | Admit: 2021-12-07 | Discharge: 2021-12-07 | Disposition: A | Discharge status: Home / Self Care | Payer: Medicaid Other | Source: Ambulatory Visit | Attending: Internal Medicine | Admitting: Internal Medicine

## 2021-12-07 DIAGNOSIS — I5022 Chronic systolic (congestive) heart failure: Secondary | ICD-10-CM

## 2021-12-13 ENCOUNTER — Telehealth: Payer: Self-pay | Admitting: Cardiovascular Disease

## 2021-12-13 NOTE — Telephone Encounter (Signed)
Pt c/o medication issue:  1. Name of Medication: sacubitril-valsartan (ENTRESTO) 97-103 MG  2. How are you currently taking this medication (dosage and times per day)?   3. Are you having a reaction (difficulty breathing--STAT)?   4. What is your medication issue?   Patient's wife states the patient has been completely out of medication for 1 week. She states she spoke with [employer] and they will need a new application for the patient. Patient's wife would like to get this started ASAP.

## 2021-12-13 NOTE — Telephone Encounter (Signed)
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  Lanora Manis can you please work on getting him re-certified, thanks

## 2021-12-13 NOTE — Telephone Encounter (Signed)
Patient follows with Heart Failure Clinic will route to them for Medication assistance.

## 2021-12-14 ENCOUNTER — Telehealth (HOSPITAL_COMMUNITY): Payer: Self-pay | Admitting: Pharmacy Technician

## 2021-12-14 ENCOUNTER — Other Ambulatory Visit (HOSPITAL_COMMUNITY): Payer: Self-pay

## 2021-12-14 NOTE — Telephone Encounter (Signed)
Advanced Heart Failure Patient Advocate Encounter  Received communication from another office that the patient needed Entresto assistance renewal. Called and spoke with patient who stated that he sent the application without the provider portion and POI.   Called [employer] to check the status of the application. The representative stated that they have not received the application. Attempted to call the patient back to discuss further, no way to leave a message.   Archer Asa, CPhT

## 2021-12-21 ENCOUNTER — Encounter: Payer: Self-pay | Admitting: Cardiovascular Disease

## 2021-12-21 ENCOUNTER — Telehealth (HOSPITAL_COMMUNITY): Payer: Self-pay | Admitting: Vascular Surgery

## 2021-12-21 NOTE — Telephone Encounter (Signed)
Pt wife called to check on Pt assistance for Entresto, from your note I saw you tried to reach pt , please contact pt wife to follow up with information you need .Marland Kitchen Please advise

## 2021-12-21 NOTE — Telephone Encounter (Signed)
Wife calling back to follow-up on the status of application for Entresto.

## 2021-12-21 NOTE — Telephone Encounter (Signed)
No encounter needed

## 2021-12-22 ENCOUNTER — Other Ambulatory Visit (HOSPITAL_COMMUNITY): Payer: Self-pay

## 2021-12-22 ENCOUNTER — Encounter (HOSPITAL_COMMUNITY): Payer: Self-pay

## 2021-12-22 NOTE — Progress Notes (Unsigned)
Medication Samples have been provided to the patient.  Drug name: Sherryll Burger       Strength: 49/51 mg        Qty: 2  LOT: [id number]  Exp.Date: 12/2023  Dosing instructions: Take 2 tablets Twice daily   The patient has been instructed regarding the correct time, dose, and frequency of taking this medication, including desired effects and most common side effects.   Smitty Cords Raymont Andreoni 9:15 AM 12/22/2021

## 2021-12-22 NOTE — Telephone Encounter (Signed)
Per the note left on 06/07 the application was never received by [employer]. I will try to call the patient again.

## 2021-12-22 NOTE — Telephone Encounter (Signed)
Thank you, I got them on the phone

## 2021-12-23 ENCOUNTER — Other Ambulatory Visit (HOSPITAL_COMMUNITY): Payer: Self-pay

## 2021-12-23 MED ORDER — SACUBITRIL-VALSARTAN 97-103 MG PO TABS
1.0000 | ORAL_TABLET | Freq: Two times a day (BID) | ORAL | 3 refills | Status: DC
Start: 2021-12-23 — End: 2023-03-08

## 2021-12-23 MED ORDER — SACUBITRIL-VALSARTAN 97-103 MG PO TABS: 1.0000 | ORAL_TABLET | Freq: Two times a day (BID) | ORAL | 3 refills | Status: DC

## 2021-12-28 NOTE — Telephone Encounter (Signed)
Application sent 06/16. Called [employer] to check the status of the patient's application. Representative stated that the application has yet to be received despite successful confirmation. Will refax.

## 2022-02-22 ENCOUNTER — Telehealth (HOSPITAL_COMMUNITY): Payer: Self-pay | Admitting: Licensed Clinical Social Worker

## 2022-02-22 NOTE — Telephone Encounter (Signed)
CSW called pt regarding Entresto samples left at front desk for pt on 6/15.  Pt reports he has since gotten his approval from [employer] and shipment of medications so no longer in need of samples  Samples returned to supply  Burna Sis, LCSW Clinical Social Worker [facility] Desk#: [phone] Cell#: [phone]

## 2022-03-22 ENCOUNTER — Encounter (HOSPITAL_COMMUNITY): Payer: Self-pay | Admitting: Internal Medicine

## 2022-05-10 ENCOUNTER — Encounter (HOSPITAL_COMMUNITY): Payer: Self-pay | Admitting: Internal Medicine

## 2022-05-10 ENCOUNTER — Ambulatory Visit (HOSPITAL_COMMUNITY)
Admission: RE | Admit: 2022-05-10 | Discharge: 2022-05-10 | Disposition: A | Discharge status: Home / Self Care | Payer: No Typology Code available for payment source | Source: Ambulatory Visit | Attending: Internal Medicine | Admitting: Internal Medicine

## 2022-05-10 VITALS — BP 154/100 | HR 75 | Wt 195.8 lb

## 2022-05-10 DIAGNOSIS — F17210 Nicotine dependence, cigarettes, uncomplicated: Secondary | ICD-10-CM | POA: Diagnosis not present

## 2022-05-10 DIAGNOSIS — Z79899 Other long term (current) drug therapy: Secondary | ICD-10-CM | POA: Diagnosis not present

## 2022-05-10 DIAGNOSIS — I10 Essential (primary) hypertension: Secondary | ICD-10-CM

## 2022-05-10 DIAGNOSIS — I5042 Chronic combined systolic (congestive) and diastolic (congestive) heart failure: Secondary | ICD-10-CM | POA: Diagnosis not present

## 2022-05-10 DIAGNOSIS — I428 Other cardiomyopathies: Secondary | ICD-10-CM | POA: Diagnosis not present

## 2022-05-10 DIAGNOSIS — Z720 Tobacco use: Secondary | ICD-10-CM

## 2022-05-10 DIAGNOSIS — I110 Hypertensive heart disease with heart failure: Secondary | ICD-10-CM | POA: Diagnosis not present

## 2022-05-10 MED ORDER — SPIRONOLACTONE 25 MG PO TABS: 25.0000 mg | ORAL_TABLET | Freq: Every day | ORAL | 6 refills | Status: DC

## 2022-05-10 MED ORDER — CARVEDILOL 12.5 MG PO TABS: 12.5000 mg | ORAL_TABLET | Freq: Two times a day (BID) | ORAL | 6 refills | Status: DC

## 2022-05-10 NOTE — Progress Notes (Signed)
ADVANCED HF CLINIC NOTE  Referring Physician: Chilton Si, MD Primary Care: Patient, No Pcp Per Primary Cardiologist: Chilton Si, MD  HPI:  Alex Lee is a 46 y.o.. male with HTN, tobacco use and systolic HF due to NICM.  Presented June 2021 with acute HF.  Echo EF 25-30% with G2DD.  Coronary CTA was negative for CAD; calcium score of 0. Negative sleep study 11/21. He saw Dr. Duke Salvia in September 2021.  Echo 09/21 showed no improvement in his EF.   Bedside echo 3/22 EF 55%. cMRI 08/22 with LVEF 45%, RVEF 52%, versy small area of subendocardial LGE in basal inferoseptal wall (can be seen in sarcoidosis but very small).   Saw Dr. Duke Salvia, 11/22. Off all meds. Restarted carvedilol and spironolactone. Referred back to [facility] to assess whether or not further workup needed to r/o sarcoidosis.  Today he returns for HF follow up with his wife. Overall feeling fine. 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 Taking all meds except Jardiance because he can't afford it. Also ran out of spiro. No edema, orthopnea or PND. Not following BP closely. Says he is starting to get HAs again like when his BP was up.    No significant FHx HF   Cardiac Studies: - Echo (6/21): EF 25-30% G2DD - CTA (6/21): negative for CAD - Echo (9/21): EF 25-30% - Bedside echo (3/22) in clinic EF 55% (EF fully recovered)  - cMRI (02/21/21): EF 45% RVEF 52% small area of LGE in basilar inferoseptal wall   Past Medical History:  Diagnosis Date   CHF (congestive heart failure) (HCC)    Childhood asthma    Essential hypertension 03/03/2020   Snoring 03/03/2020   Tobacco abuse    Tobacco abuse 04/08/2020   Current Outpatient Medications  Medication Sig Dispense Refill   albuterol (VENTOLIN HFA) 108 (90 Base) MCG/ACT inhaler Inhale 2 puffs into the lungs every 6 (six) hours as needed for wheezing or shortness of breath. 18 g 0   carvedilol (COREG) 6.25 MG tablet Take 1.5  tablets (9.375 mg total) by mouth 2 (two) times daily with a meal. 180 tablet 3   sacubitril-valsartan (ENTRESTO) 97-103 MG Take 1 tablet by mouth 2 (two) times daily. 180 tablet 3   empagliflozin (JARDIANCE) 10 MG TABS tablet Take 1 tablet (10 mg total) by mouth daily. (Patient not taking: Reported on 05/10/2022) 30 tablet 4   spironolactone (ALDACTONE) 25 MG tablet Take 1 tablet (25 mg total) by mouth daily. (Patient not taking: Reported on 05/10/2022) 90 tablet 3   No current facility-administered medications for this encounter.    No Known Allergies    Social History   Socioeconomic History   Marital status: Married    Spouse name: Not on file   Number of children: Not on file   Years of education: Not on file   Highest education level: Not on file  Occupational History   Not on file  Tobacco Use   Smoking status: Every Day    Packs/day: 0.50    Types: Cigarettes   Smokeless tobacco: Never  Substance and Sexual Activity   Alcohol use: Yes    Alcohol/week: 10.0 standard drinks of alcohol    Types: 10 Cans of beer per week   Drug use: Not Currently   Sexual activity: Not on file  Other Topics Concern   Not on file  Social History Narrative   Not on file   Social  Determinants of Health   Financial Resource Strain: Not on file  Food Insecurity: Not on file  Transportation Needs: Not on file  Physical Activity: Not on file  Stress: Not on file  Social Connections: Not on file  Intimate Partner Violence: Not on file   Family History  Problem Relation Age of Onset   Hypertension Mother    Hypertension Father    Wt Readings from Last 3 Encounters:  05/10/22 88.8 kg (195 lb 12.8 oz)  08/05/21 89.5 kg (197 lb 6.4 oz)  06/08/21 90 kg (198 lb 6.4 oz)   BP (!) 154/100   Pulse 75   Wt 88.8 kg (195 lb 12.8 oz)   SpO2 98%   BMI 30.67 kg/m   PHYSICAL EXAM: General:  Well appearing. No resp difficulty HEENT: normal Neck: supple. no JVD. Carotids 2+ bilat; no bruits.  No lymphadenopathy or thryomegaly appreciated. Cor: PMI nondisplaced. Regular rate & rhythm. No rubs, gallops or murmurs. Lungs: clear Abdomen: soft, nontender, nondistended. No hepatosplenomegaly. No bruits or masses. Good bowel sounds. Extremities: no cyanosis, clubbing, rash, edema Neuro: alert & orientedx3, cranial nerves grossly intact. moves all 4 extremities w/o difficulty. Affect pleasant   ASSESSMENT & PLAN:  1. Chronic systolic HF due to NICM - possible HTN CM - Echo 6/21 EF 25-30% G2DD RV ok - CTA 6/21 no CAD - Echo 9/21 EF 25-30% -  There is notation of a small lymph node noted on chest CT but no obvious mediastinal lymphadenopathy that raises suspicion for sarcoidosis.   - Bedside echo 3/22 in clinic EF 55% (EF fully recovered)  - cMRI 02/21/21 EF 45% RVEF 52% small area of LGE in basilar inferoseptal wall  - cMRI reviewed and strongly doubt that LGE represents cardiac sarcoid.  - repeat cMRI 5/23 EF 44% Very small, well-circumscribed area of subendocardial LGE in the basal inferoseptal wall. No change - NYHA I. Volume status ok  - Continue Entresto 97/103 mg bid. - Increase carvedilol to 12.5 mg bid. - Restart spiro 25 mg daily. - Wil try to restart Jardiance but will focus on med titration for HTN first - In reviewing MRIs, defect is in an area c/w sarcoid but it is small and completely stable. May have old, isolated sarcoid lesion but no evidence of active disease or other pathology. No role for biopsy or PET scan at this time   2. Essential HTN - Blood pressure back up  - Med changes as above - Will have him f/u back up in the HTN Clinic with Dr. Oval Linsey - would likely benefit from GLP-1RA for weight loss  3. Tobacco use - Encouraged cessation   4. Snoring  - sleep study 11/21 AHI 3.4 (normal)     Glori Bickers, MD  12:13 PM

## 2022-05-10 NOTE — Patient Instructions (Signed)
Medication Changes:  Increase Carvedilol to 12.5 mg Twice daily   Restart Spironolactone 25 mg Daily  Lab Work:  none  Testing/Procedures:  none  Referrals:  You have been referred to Dr Oval Linsey, the Hypertension Clinic, they will call you for an appointment  Special Instructions // Education:  Do the following things EVERYDAY: Weigh yourself in the morning before breakfast. Write it down and keep it in a log. Take your medicines as prescribed Eat low salt foods--Limit salt (sodium) to 2000 mg per day.  Stay as active as you can everyday Limit all fluids for the day to less than 2 liters   Follow-Up in: 6 months (May 2024), **PLEASE CALL OUR OFFICE IN MARCH TO SCHEDULE THIS APPOINTMENT  At the [facility] Failure Clinic, you and your health needs are our priority. We have a designated team specialized in the treatment of Heart Failure. This Care Team includes your primary Heart Failure Specialized Cardiologist (physician), Advanced Practice Providers (APPs- Physician Assistants and Nurse Practitioners), and Pharmacist who all work together to provide you with the care you need, when you need it.   You may see any of the following providers on your designated Care Team at your next follow up:  Dr. Glori Bickers Dr. Loralie Champagne Dr. Roxana Hires, NP Lyda Jester, [place] [facility] [place], [place] Forestine Na, NP Audry Riles, PharmD   Please be sure to bring in all your medications bottles to every appointment.   Need to Contact us:  If you have any questions or concerns before your next appointment please send [place] a message through [place] or call our office at [phone].    TO LEAVE A MESSAGE FOR THE NURSE SELECT OPTION 2, PLEASE LEAVE A MESSAGE INCLUDING: YOUR NAME DATE OF BIRTH CALL BACK NUMBER REASON FOR CALL**this is important as we prioritize the call backs  YOU WILL RECEIVE A CALL BACK THE SAME DAY AS LONG AS YOU CALL  BEFORE 4:00 PM

## 2022-06-08 ENCOUNTER — Encounter: Payer: Self-pay | Admitting: Cardiovascular Disease

## 2022-06-19 ENCOUNTER — Ambulatory Visit (HOSPITAL_BASED_OUTPATIENT_CLINIC_OR_DEPARTMENT_OTHER): Payer: No Typology Code available for payment source | Admitting: Family

## 2022-07-04 ENCOUNTER — Telehealth (HOSPITAL_BASED_OUTPATIENT_CLINIC_OR_DEPARTMENT_OTHER): Payer: Self-pay | Admitting: Cardiovascular Disease

## 2022-07-04 NOTE — Telephone Encounter (Signed)
*  STAT* If patient is at the pharmacy, call can be transferred to refill team.   1. Which medications need to be refilled? (please list name of each medication and dose if known)    albuterol (VENTOLIN HFA) 108 (90 Base) MCG/ACT inhaler    2. Which pharmacy/location (including street and city if local pharmacy) is medication to be sent to? [facility] [address] [place], [place] - [address]   3. Do they need a 30 day or 90 day supply?   Requesting a callback to inform them if our office is not able to fill. Patient does not have a PCP.

## 2022-07-04 NOTE — Telephone Encounter (Signed)
Called patient to explain we couldn't fill Ventolin inhaler was not prescribed here. Explained inhaler was originally prescribed by Dr. Allena Katz or PCP. Jim Like MHA RN CCM

## 2022-07-14 ENCOUNTER — Ambulatory Visit (HOSPITAL_BASED_OUTPATIENT_CLINIC_OR_DEPARTMENT_OTHER): Payer: No Typology Code available for payment source | Admitting: Family

## 2022-07-14 NOTE — Progress Notes (Deleted)
Advanced Hypertension Clinic Initial Assessment:    Date:  07/14/2022   ID:  Alex Lee, DOB 1975-09-10, MRN [id number]  PCP:  Patient, No Pcp Per  Cardiologist:  Skeet Latch, MD  Nephrologist:  Referring MD: No ref. provider found   CC: Hypertension  History of Present Illness:    Alex Lee is a 47 y.o. male with a hx of *** here to establish care in the Advanced Hypertension Clinic.   hypertension, tobacco use, systolic heart failure due to nonischemic cardiomyopathy last seen 05/10/2022 by Dr. Haroldine Laws.  Presented in June 2021 with heart failure with EF 25 to 30% and grade 2 diastolic dysfunction.  Cardiac CTA negative for CAD with calcium score of 0.  Negative sleep study 05/2020.  Echocardiogram 03/2020 no improvement in LVEF.  Bedside echo 09/2020 LVEF 55%.  Cardiac MRI 02/2021 with LVEF 45%, RVEF 52%, very small area of subendocardial LGE and basal inferoseptal wall.  He saw Dr. Saunders Revel of November 2022 and was noted to be off of all of his medications.  Carvedilol and spironolactone were resumed and he was referred to advanced heart failure clinic for consideration of further workup related to sarcoidosis.  Last saw Dr. Haroldine Laws 05/10/2022.  He was overall feeling well.  He is not taking Jardiance as it was cost prohibitive.  Spironolactone was resumed as his blood pressure was above goal  Previous antihypertensives:  Secondary Causes of Hypertension  Medications/Herbal: OCP, steroids, stimulants, antidepressants, weight loss medication, immune suppressants, NSAIDs, sympathomimetics, alcohol, caffeine, licorice, ginseng, St. John's wort, chemo  Sleep Apnea Renal artery stenosis Hyperaldosteronism Hyper/hypothyroidism Pheochromocytoma: palpitations, tachycardia, headache, diaphoresis (plasma metanephrines) Cushing's syndrome: Cushingoid facies, central obesity, proximal muscle weakness, and ecchymoses, adrenal incidentaloma (cortisol) Coarctation of the aorta  Past  Medical History:  Diagnosis Date   CHF (congestive heart failure) ([place])    Childhood asthma    Essential hypertension 03/03/2020   Snoring 03/03/2020   Tobacco abuse    Tobacco abuse 04/08/2020    No past surgical history on file.  Current Medications: No outpatient medications have been marked as taking for the 07/14/22 encounter (Appointment) with Loel Dubonnet, NP.     Allergies:   Patient has no known allergies.   Social History   Socioeconomic History   Marital status: Married    Spouse name: Not on file   Number of children: Not on file   Years of education: Not on file   Highest education level: Not on file  Occupational History   Not on file  Tobacco Use   Smoking status: Every Day    Packs/day: 0.50    Types: Cigarettes   Smokeless tobacco: Never  Substance and Sexual Activity   Alcohol use: Yes    Alcohol/week: 10.0 standard drinks of alcohol    Types: 10 Cans of beer per week   Drug use: Not Currently   Sexual activity: Not on file  Other Topics Concern   Not on file  Social History Narrative   Not on file   Social Determinants of Health   Financial Resource Strain: Not on file  Food Insecurity: Not on file  Transportation Needs: Not on file  Physical Activity: Not on file  Stress: Not on file  Social Connections: Not on file     Family History: The patient's ***family history includes Hypertension in his father and mother.  ROS:   Please see the history of present illness.    *** All other systems reviewed and are negative.  EKGs/Labs/Other Studies Reviewed:    - Echo (6/21): EF 25-30% G2DD - CTA (6/21): negative for CAD - Echo (9/21): EF 25-30% - Bedside echo (3/22) in clinic EF 55% (EF fully recovered)  - cMRI (02/21/21): EF 45% RVEF 52% small area of LGE in basilar inferoseptal wall  -cMRI (11/18/21): EF 44%, RVEF 52%, small well-circumscribed area of subendocardial LGE and basal inferior septal wall unchanged compared to prior  EKG:   EKG is *** ordered today.  The ekg ordered today demonstrates ***  Recent Labs: 08/05/2021: BUN 11; Creatinine, Ser 1.00; Potassium 4.0; Sodium 140 11/17/2021: Hemoglobin 15.4; Platelets 245   Recent Lipid Panel    Component Value Date/Time   CHOL 191 12/29/2019 0417   TRIG 84 12/29/2019 0417   HDL 33 (L) 12/29/2019 0417   CHOLHDL 5.8 12/29/2019 0417   VLDL 17 12/29/2019 0417   LDLCALC 141 (H) 12/29/2019 0417    Physical Exam:   VS:  There were no vitals taken for this visit. , BMI There is no height or weight on file to calculate BMI. GENERAL:  Well appearing HEENT: Pupils equal round and reactive, fundi not visualized, oral mucosa unremarkable NECK:  No jugular venous distention, waveform within normal limits, carotid upstroke brisk and symmetric, no bruits, no thyromegaly LYMPHATICS:  No cervical adenopathy LUNGS:  Clear to auscultation bilaterally HEART:  RRR.  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-   HTN -   Tobacco use -   Snoring -   Screening for Secondary Hypertension: { Click here to document screening for secondary causes of HTN  :[id number]}    Relevant Labs/Studies:    Latest Ref Rng & Units 08/05/2021    4:17 PM 09/09/2020   12:46 PM 04/22/2020   11:24 AM  Basic Labs  Sodium 135 - 145 mmol/L 140  138  136   Potassium 3.5 - 5.1 mmol/L 4.0  4.1  4.2   Creatinine 0.61 - 1.24 mg/dL 8.24  2.35  3.61         Latest Ref Rng & Units 12/29/2019   10:57 AM  Thyroid   TSH 0.350 - 4.500 uIU/mL 1.147                 04/13/2020    9:43 AM  Renovascular   Renal Artery [place] Completed Yes        he consents to be monitored in our remote patient monitoring program through [employer].  he will track his blood pressure twice daily and understands that these trends will help [place] to adjust his medications as needed prior to his next appointment.  he *** interested in enrolling in the PREP exercise and nutrition program through the [facility].     Disposition:    FU with MD/PharmD in {gen number 4-43:154008} {Days to years:10300}    Medication Adjustments/Labs and Tests Ordered: Current medicines are reviewed at length with the patient today.  Concerns regarding medicines are outlined above.  No orders of the defined types were placed in this encounter.  No orders of the defined types were placed in this encounter.    Signed, Loel Dubonnet, NP  07/14/2022 7:35 AM    [place]

## 2022-09-04 ENCOUNTER — Ambulatory Visit (HOSPITAL_BASED_OUTPATIENT_CLINIC_OR_DEPARTMENT_OTHER): Payer: No Typology Code available for payment source | Admitting: Family

## 2022-09-22 IMAGING — MR MR CARD MORPHOLOGY WO/W CM
45 of 48 series · 45 of 48 positions shown · IV contrast (gadavist)
Comparison: none

CLINICAL DATA: Possible cardiac sarcoidosis

EXAM:
CARDIAC MRI
TECHNIQUE: The patient was scanned on a 1.5 Tesla GE magnet. A dedicated
cardiac coil was used. Functional imaging was done using Fiesta
sequences. [DATE], and 4 chamber views were done to assess for RWMA's.
Modified Treminio rule using a short axis stack was used to
calculate an ejection fraction on a dedicated work station using
Circle software. The patient received 13 cc of Gadavist. After 10
minutes inversion recovery sequences were used to assess for
infiltration and scar tissue.
CONTRAST:  Gadavist 13 cc

[Series 4: t2_haste_db_tra_bh · axial · 8.0mm · 1.48mm/px · 1 of 16 slices shown]
[im 1/16]
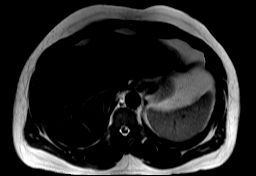

[Series 8: bSSFP · sagittal · 8.0mm · 1.61mm/px · 1 of 25 slices shown (1 of 19)]
[im 1/25]
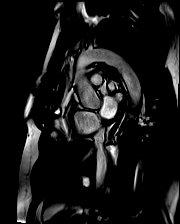

[Series 9: bSSFP · sagittal · 8.0mm · 1.61mm/px · 1 of 25 slices shown (2 of 19)]
[im 1/25]
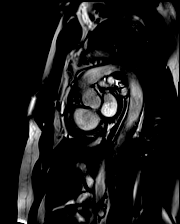

[Series 10: bSSFP · sagittal · 8.0mm · 1.61mm/px · 1 of 25 slices shown (3 of 19)]
[im 1/25]
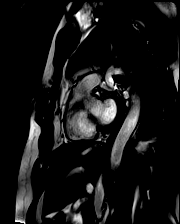

[Series 11: bSSFP · sagittal · 8.0mm · 1.61mm/px · 1 of 25 slices shown (4 of 19)]
[im 1/25]
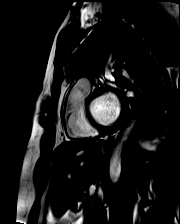

[Series 12: bSSFP · sagittal · 8.0mm · 1.61mm/px · 1 of 25 slices shown (5 of 19)]
[im 1/25]
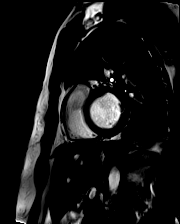

[Series 13: bSSFP · sagittal · 8.0mm · 1.61mm/px · 1 of 25 slices shown (6 of 19)]
[im 1/25]
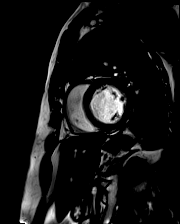

[Series 14: bSSFP · sagittal · 8.0mm · 1.61mm/px · 1 of 25 slices shown (7 of 19)]
[im 1/25]
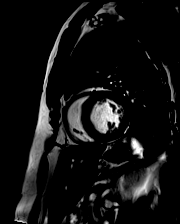

[Series 15: bSSFP · sagittal · 8.0mm · 1.61mm/px · 1 of 25 slices shown (8 of 19)]
[im 1/25]
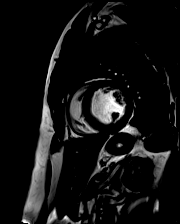

[Series 16: bSSFP · sagittal · 8.0mm · 1.61mm/px · 1 of 25 slices shown (9 of 19)]
[im 1/25]
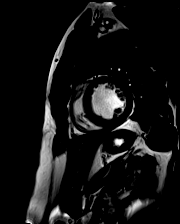

[Series 17: bSSFP · sagittal · 8.0mm · 1.61mm/px · 1 of 25 slices shown (10 of 19)]
[im 1/25]
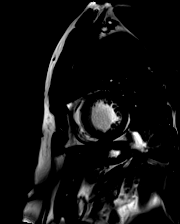

[Series 18: bSSFP · sagittal · 8.0mm · 1.61mm/px · 1 of 25 slices shown (11 of 19)]
[im 1/25]
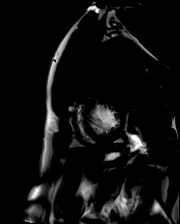

[Series 19: bSSFP · sagittal · 8.0mm · 1.61mm/px · 1 of 25 slices shown (12 of 19)]
[im 1/25]
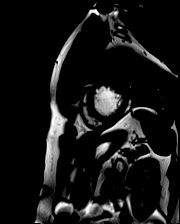

[Series 20: bSSFP · sagittal · 8.0mm · 1.61mm/px · 1 of 25 slices shown (13 of 19)]
[im 1/25]
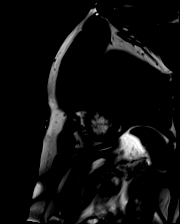

[Series 21: bSSFP · sagittal · 8.0mm · 1.61mm/px · 1 of 25 slices shown (14 of 19)]
[im 1/25]
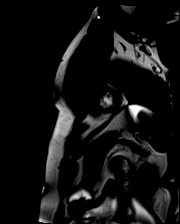

[Series 22: bSSFP · sagittal · 8.0mm · 1.61mm/px · 1 of 25 slices shown (15 of 19)]
[im 1/25]
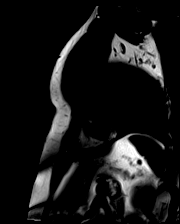

[Series 23: bSSFP · sagittal · 8.0mm · 1.61mm/px · 1 of 25 slices shown (16 of 19)]
[im 1/25]
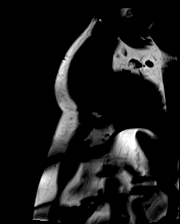

[Series 24: bSSFP · axial · 6.0mm · 1.41mm/px · 1 of 25 slices shown (17 of 19)]
[im 1/25]
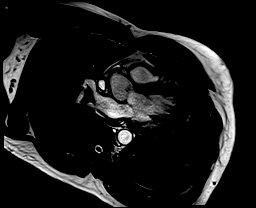

[Series 26: bSSFP · axial · 6.0mm · 1.41mm/px · 1 of 25 slices shown (18 of 19)]
[im 1/25]
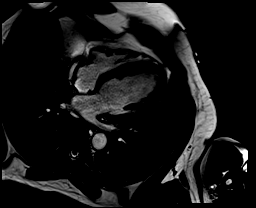

[Series 28: (id)_long_t1 · oblique · 8.0mm · 1.56mm/px · 1 of 24 slices shown]
[im 1/24]
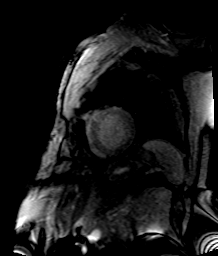

[Series 29: (id)_long_t1_moco · oblique · 8.0mm · 1.56mm/px · 1 of 24 slices shown]
[im 1/24]
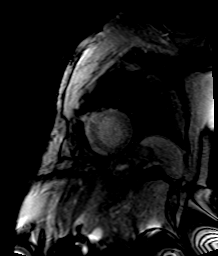

[Series 30: (id)_long_t1_moco_t1 · 1 of 3 slices shown (1 of 2)]
[im 1/3]
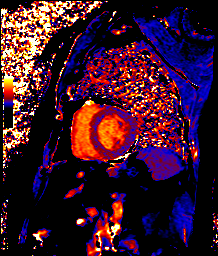

[Series 30: (id)_long_t1_moco_t1 · oblique · 8.0mm · 1.56mm/px · 1 of 3 slices shown (2 of 2)]
[im 1/3]
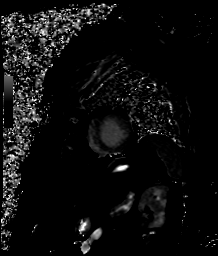

[Series 32: (id)_trufi · oblique · 8.0mm · 2.08mm/px · 1 of 9 slices shown]
[im 1/9]
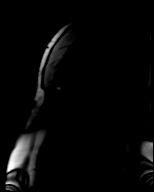

[Series 33: (id)_trufi_moco · oblique · 8.0mm · 2.08mm/px · 1 of 9 slices shown]
[im 1/9]
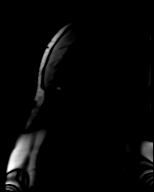

[Series 34: (id)_trufi_moco_t2 · oblique · 8.0mm · 2.08mm/px · 1 of 3 slices shown]
[im 1/3]
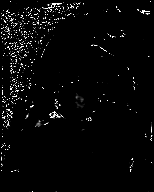

[Series 36: STIR · axial · 8.0mm · 1.92mm/px · 1 of 1 slices shown (1 of 2)]
[im 1/1]
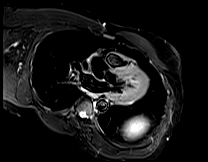

[Series 37: STIR · axial · 8.0mm · 1.92mm/px · 1 of 1 slices shown (2 of 2)]
[im 1/1]
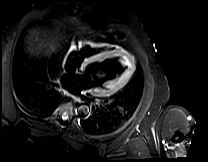

[Series 39: cine_trufi_short axis_cs_2_shot · oblique · 8.0mm · 1.48mm/px · 1 of 25 slices shown (1 of 15)]
[im 1/25]
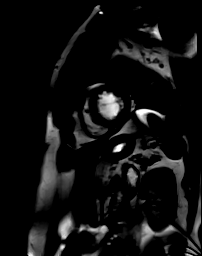

[Series 39: cine_trufi_short axis_cs_2_shot · oblique · 8.0mm · 1.48mm/px · 1 of 25 slices shown (2 of 15)]
[im 1/25]
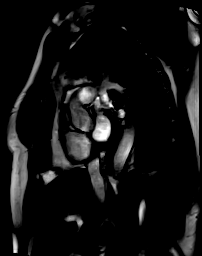

[Series 39: cine_trufi_short axis_cs_2_shot · oblique · 8.0mm · 1.48mm/px · 1 of 25 slices shown (3 of 15)]
[im 1/25]
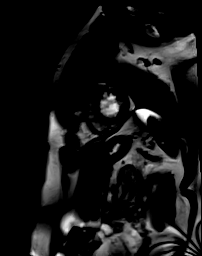

[Series 39: cine_trufi_short axis_cs_2_shot · oblique · 8.0mm · 1.48mm/px · 1 of 25 slices shown (4 of 15)]
[im 1/25]
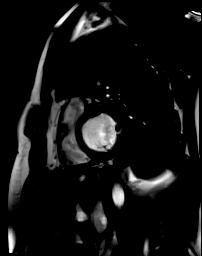

[Series 39: cine_trufi_short axis_cs_2_shot · oblique · 8.0mm · 1.48mm/px · 1 of 25 slices shown (5 of 15)]
[im 1/25]
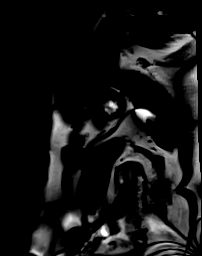

[Series 39: cine_trufi_short axis_cs_2_shot · oblique · 8.0mm · 1.48mm/px · 1 of 25 slices shown (6 of 15)]
[im 1/25]
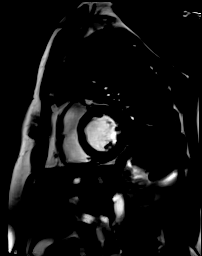

[Series 39: cine_trufi_short axis_cs_2_shot · oblique · 8.0mm · 1.48mm/px · 1 of 25 slices shown (7 of 15)]
[im 1/25]
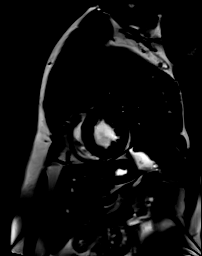

[Series 39: cine_trufi_short axis_cs_2_shot · oblique · 8.0mm · 1.48mm/px · 1 of 25 slices shown (8 of 15)]
[im 1/25]
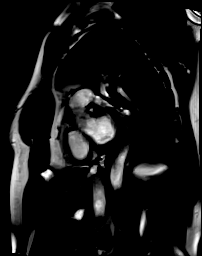

[Series 39: cine_trufi_short axis_cs_2_shot · oblique · 8.0mm · 1.48mm/px · 1 of 25 slices shown (9 of 15)]
[im 1/25]
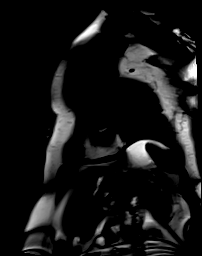

[Series 39: cine_trufi_short axis_cs_2_shot · oblique · 8.0mm · 1.48mm/px · 1 of 25 slices shown (10 of 15)]
[im 1/25]
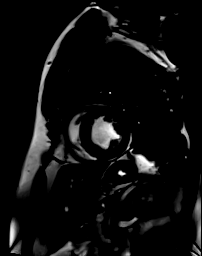

[Series 39: cine_trufi_short axis_cs_2_shot · oblique · 8.0mm · 1.48mm/px · 1 of 25 slices shown (11 of 15)]
[im 1/25]
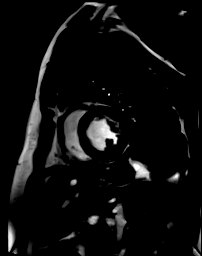

[Series 39: cine_trufi_short axis_cs_2_shot · oblique · 8.0mm · 1.48mm/px · 1 of 25 slices shown (12 of 15)]
[im 1/25]
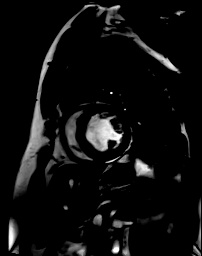

[Series 39: cine_trufi_short axis_cs_2_shot · oblique · 8.0mm · 1.48mm/px · 1 of 25 slices shown (13 of 15)]
[im 1/25]
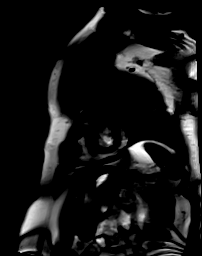

[Series 39: cine_trufi_short axis_cs_2_shot · oblique · 8.0mm · 1.48mm/px · 1 of 25 slices shown (14 of 15)]
[im 1/25]
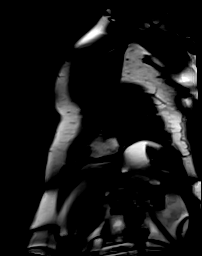

[Series 39: cine_trufi_short axis_cs_2_shot · oblique · 8.0mm · 1.48mm/px · 1 of 25 slices shown (15 of 15)]
[im 1/25]
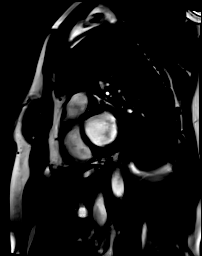

[Series 40: bSSFP · oblique · 6.0mm · 1.41mm/px · 1 of 25 slices shown (19 of 19)]
[im 1/25]
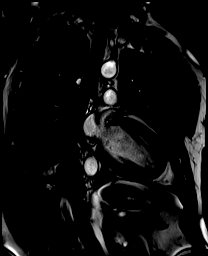

[Series 41: pre short axis · oblique · non-contrast · 8.0mm · 2.38mm/px · 1 of 10 slices shown]
[im 1/10]
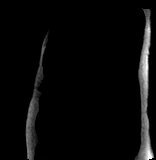

[45 of 48 positions shown; findings below may reference images not displayed]

FINDINGS: Limited images of the lung fields showed no gross abnormalities.

Normal left ventricular size with normal wall thickness. Basal
septal hypokinesis, LV EF 44%. Normal right ventricular size and
systolic function, EF 52%. Normal left and right atrial sizes.
Trileaflet aortic valve with no significant stenosis or
regurgitation. No significant mitral regurgitation noted.

On delayed enhancement imaging, there is a very small,
well-circumscribed area of subendocardial late gadolinium
enhancement (LGE) in the basal inferoseptal wall.

Measurements:
LVEDV 203 mL

LVEDVi 99 mL/m2

LVSV 89 mL

LVEF 44%

RVEDV 156 mL
RVEDVi 76 mL/m2

RVSV 80 mL

RVEF 51%

T1 3759, ECV 37% in septum
IMPRESSION: 1.  Normal LV size with basal septal hypokinesis, EF 44%.

2.  Normal RV size with EF 52%.

3. Very small, well-circumscribed area of subendocardial LGE in the
basal inferoseptal wall. This is not suggestive of coronary disease.
Can see nodular-type LGE in the basal septal wall in cardiac

sarcoidosis though the lesion seen is very small. It is unchanged
compared to prior MRI.

4. Elevated extracellular volume percentage in the septum, not high
enough to suggest cardiac amyloidosis.

Interisti Tonmoy

## 2022-11-30 ENCOUNTER — Telehealth: Payer: Self-pay | Admitting: Cardiovascular Disease

## 2022-11-30 NOTE — Telephone Encounter (Signed)
Pt c/o medication issue:  1. Name of Medication: sacubitril-valsartan (ENTRESTO) 97-103 MG   2. How are you currently taking this medication (dosage and times per day)? Take 1 tablet by mouth 2 (two) times daily.   3. Are you having a reaction (difficulty breathing--STAT)? No  4. What is your medication issue? Pt's spouse is requesting a callback to discuss the application paperwork for this medication for pt to continue getting assistance. She stated their on vacation right now in [place], [place] so she'd like to see about doing a video visit to check him and then she can also fax the application to the office, Please advise

## 2022-11-30 NOTE — Telephone Encounter (Signed)
Returned call to patient, no dpr on file to speak with wife, no answer, unable to leave message, sent a mychart message also.

## 2022-12-01 ENCOUNTER — Other Ambulatory Visit (HOSPITAL_COMMUNITY): Payer: Self-pay

## 2022-12-01 ENCOUNTER — Telehealth (HOSPITAL_COMMUNITY): Payer: Self-pay | Admitting: Family Medicine

## 2022-12-01 NOTE — Telephone Encounter (Signed)
2nd call attempt, no DPR on file to call wife back. Spoke with patient, it is time for his new application for [facility] patient assistance. Advised him we can mail him his part and he will either need to mail or bring it back to the office. He is currently in [place], [place] and would like [place] to mail it to him there. The address is [address] [place], [place] [address]. Scheduled patient with Gillian Shields, NP July 3 as he will not be back from [place] until the end of June.   Advised patient up update DPR at his next visit

## 2023-01-10 ENCOUNTER — Ambulatory Visit (HOSPITAL_BASED_OUTPATIENT_CLINIC_OR_DEPARTMENT_OTHER): Payer: No Typology Code available for payment source | Admitting: Family

## 2023-01-10 NOTE — Progress Notes (Deleted)
  Cardiology Office Note:  .   Date:  01/10/2023  ID:  Kathie Rhodes, DOB [birth year], MRN [id number] PCP: Patient, No Pcp Per  [facility] Providers Cardiologist:  Chilton Si, MD { Click to update primary MD,subspecialty MD or APP then REFRESH:1}   History of Present Illness: .   Coree Hagenow is a 47 y.o. 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  Last seen by advanced heart failure team Dr. Gala Romney 05/10/2022.  Presented in June 2021 with acute heart failure.  Echo LVEF 25 to 30% with grade 2 diastolic dysfunction.  Coronary CTA negative for CAD with calcium score of 0.  Negative sleep study 05/2020.  Echo 03/2020 with no improvement in LVEF.  Cardiac MRI 02/2021 with LVEF 45%, RVEF 52%, very small area of subendocardial LGE and basal inferoseptal wall.  He saw Dr. Okey Dupre of 05/2021 noted to be off all of his medications which are resumed including carvedilol and spironolactone.  He was referred to [facility] to assess whether further workup required for sarcoidosis.  Last seen by Dr. Gala Romney 05/10/2022.  He is taking all of his medications except for Jardiance due to cost.  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  No negation for biopsy or PET.  As BP was elevated carvedilol increased to 12.5 mg twice daily.  Entresto, spironolactone were continued.  Was recommended to titrate medications for hypertension and then consider resumption of Jardiance.  ROS: ***  Studies Reviewed: .        *** Risk Assessment/Calculations:     No BP recorded.  {Refresh Note OR Click here to enter BP  :1}***       Physical Exam:   VS:  There were no vitals taken for this visit.   Wt Readings from Last 3 Encounters:  05/10/22 195 lb 12.8 oz (88.8 kg)  08/05/21 197 lb 6.4 oz (89.5 kg)  06/08/21 198 lb 6.4 oz  (90 kg)    GEN: Well nourished, well developed in no acute distress NECK: No JVD; No carotid bruits CARDIAC: ***RRR, no murmurs, rubs, gallops RESPIRATORY:  Clear to auscultation without rales, wheezing or rhonchi  ABDOMEN: Soft, non-tender, non-distended EXTREMITIES:  No edema; No deformity   ASSESSMENT AND PLAN: .    HTN -   HFrEF / NICM -   Tobacco use -   Obesity -   Snoring - sleep study 05/2020 with no sleep apnea.     {Are you ordering a CV Procedure (e.g. stress test, cath, DCCV, TEE, etc)?   Press F2        :[id number]}  Dispo: ***  Signed, Alver Sorrow, NP

## 2023-01-20 NOTE — Progress Notes (Deleted)
Cardiology Office Note:  .   Date:  01/20/2023  ID:  Kathie Rhodes, DOB 1976/07/01, MRN [id number] PCP: Patient, No Pcp Per  [facility] Providers Cardiologist:  Chilton Si, MD { Click to update primary MD,subspecialty MD or APP then REFRESH:1}   Patient Profile: .      PMH: NICM/Chronic HFrEF Echo 12/2019 EF 25-30, G2DD Echo 03/2020 EF 25-30 Bedside echo 09/2020 in clinic EF 55 (fully recovered) cMRI 02/2021 EF 45% RVEF 52%, small area of LGE in basilar inferoseptal wall Coronary CTA with calcium score of 0 Hypertension Tobacco abuse Medication noncompliance History of snoring with no evidence of OSA on sleep study 05/2020  He presented June 2021 with acute heart failure.  Echo EF 25 to 30% with G2 DD.  He underwent coronary CT which showed a calcium score of 0 and no evidence of coronary artery disease.  He was referred to advanced heart failure clinic.  His follow-up echo September 2021 did not show improvement in EF.  Bedside echo in clinic revealed EF 55%, EF fully recovered.  Cardiac MRI 02/2021 revealed LVEF 45%, RVEF 52%, very small area of subendocardial LGE in the basal inferior septal wall (can be seen in sarcoidosis but very small).  Seen in follow-up by Dr. Duke Salvia November 2022 and was not taking medications.  Nadolol and spironolactone were restarted and he was referred back to [facility] to determine need for sarcoid w/u.  Repeat cMRI 11/2021 EF 44%, very small well-circumscribed area of subendocardial LGE in the basal inferior septal wall, no change.    Last cardiology clinic visit was 05/10/2022 with Dr. Gala Romney in Advanced Heart Failure Clinic. Per Dr. Gala Romney, in reviewing MRIs, defects in an area C/W sarcoid but it is small and completely stable.  May have old, isolated sarcoid lesion but no evidence of active disease or pathology.  He was advised to follow-up with Dr. Duke Salvia in hypertension clinic.  No indication for repeat testing at that time.       History  of Present Illness: .   Mckinley Clinton is a *** 47 y.o. male ***  ROS: ***       Studies Reviewed: .        *** Risk Assessment/Calculations:   {Does this patient have ATRIAL FIBRILLATION?:[phone]} No BP recorded.  {Refresh Note OR Click here to enter BP  :1}***       Physical Exam:   VS:  There were no vitals taken for this visit.   Wt Readings from Last 3 Encounters:  05/10/22 195 lb 12.8 oz (88.8 kg)  08/05/21 197 lb 6.4 oz (89.5 kg)  06/08/21 198 lb 6.4 oz (90 kg)    GEN: Well nourished, well developed in no acute distress NECK: No JVD; No carotid bruits CARDIAC: ***RRR, no murmurs, rubs, gallops RESPIRATORY:  Clear to auscultation without rales, wheezing or rhonchi  ABDOMEN: Soft, non-tender, non-distended EXTREMITIES:  No edema; No deformity     ASSESSMENT AND PLAN: .    Chronic HFrEF/NICM: Hospital admission 12/2019 with AoC CHF with EF 25-30%, G2DD, RV ok. No CAD on coronary CTA. No improvement in EF on echo 03/2020. Full recovery of LVEF on bedside echo in AHF clinic 09/2020. cMRI 02/2021 EF 45, RVEF 52. cMRI 11/2021 EF 44%. Question of sarcoid felt to be stable per Dr. Gala Romney.   Hypertension: Tobacco use: Hyperlipidemia?    {Are you ordering a CV Procedure (e.g. stress test, cath, DCCV, TEE, etc)?   Press F2        :  [id number]}  Dispo: ***  Signed, Eligha Bridegroom, NP-C

## 2023-01-22 ENCOUNTER — Encounter: Payer: Self-pay | Admitting: Nurse Practitioner

## 2023-01-22 ENCOUNTER — Ambulatory Visit: Payer: 59 | Attending: Nurse Practitioner | Admitting: Nurse Practitioner

## 2023-02-13 ENCOUNTER — Encounter (HOSPITAL_COMMUNITY): Payer: No Typology Code available for payment source

## 2023-03-08 ENCOUNTER — Encounter (HOSPITAL_COMMUNITY): Payer: Self-pay

## 2023-03-08 ENCOUNTER — Ambulatory Visit (HOSPITAL_COMMUNITY)
Admission: RE | Admit: 2023-03-08 | Discharge: 2023-03-08 | Disposition: A | Discharge status: Home / Self Care | Payer: 59 | Source: Ambulatory Visit | Attending: Cardiology | Admitting: Cardiology

## 2023-03-08 VITALS — BP 136/94 | HR 75 | Wt 198.2 lb

## 2023-03-08 DIAGNOSIS — F17210 Nicotine dependence, cigarettes, uncomplicated: Secondary | ICD-10-CM | POA: Diagnosis not present

## 2023-03-08 DIAGNOSIS — R0683 Snoring: Secondary | ICD-10-CM | POA: Diagnosis not present

## 2023-03-08 DIAGNOSIS — Z7984 Long term (current) use of oral hypoglycemic drugs: Secondary | ICD-10-CM | POA: Insufficient documentation

## 2023-03-08 DIAGNOSIS — Z79899 Other long term (current) drug therapy: Secondary | ICD-10-CM | POA: Diagnosis not present

## 2023-03-08 DIAGNOSIS — I5022 Chronic systolic (congestive) heart failure: Secondary | ICD-10-CM | POA: Diagnosis not present

## 2023-03-08 DIAGNOSIS — I10 Essential (primary) hypertension: Secondary | ICD-10-CM | POA: Diagnosis not present

## 2023-03-08 DIAGNOSIS — I5042 Chronic combined systolic (congestive) and diastolic (congestive) heart failure: Secondary | ICD-10-CM

## 2023-03-08 DIAGNOSIS — Z91148 Patient's other noncompliance with medication regimen for other reason: Secondary | ICD-10-CM | POA: Diagnosis not present

## 2023-03-08 DIAGNOSIS — I428 Other cardiomyopathies: Secondary | ICD-10-CM | POA: Insufficient documentation

## 2023-03-08 DIAGNOSIS — I110 Hypertensive heart disease with heart failure: Secondary | ICD-10-CM | POA: Diagnosis not present

## 2023-03-08 LAB — BASIC METABOLIC PANEL
Anion gap: 8 (ref 5–15)
BUN: 18 mg/dL (ref 6–20)
CO2: 24 mmol/L (ref 22–32)
Calcium: 8.7 mg/dL — ABNORMAL LOW (ref 8.9–10.3)
Chloride: 108 mmol/L (ref 98–111)
Creatinine, Ser: 1.25 mg/dL — ABNORMAL HIGH (ref 0.61–1.24)
GFR, Estimated: >60 mL/min (ref >60)
Glucose, Bld: 90 mg/dL (ref 70–99)
Potassium: 3.7 mmol/L (ref 3.5–5.1)
Sodium: 140 mmol/L (ref 135–145)

## 2023-03-08 LAB — BRAIN NATRIURETIC PEPTIDE: B Natriuretic Peptide: 28.2 pg/mL (ref 0.0–100.0)

## 2023-03-08 MED ORDER — SPIRONOLACTONE 25 MG PO TABS: 12.5000 mg | ORAL_TABLET | Freq: Every day | ORAL | 5 refills | Status: DC

## 2023-03-08 MED ORDER — CARVEDILOL 12.5 MG PO TABS: 12.5000 mg | ORAL_TABLET | Freq: Two times a day (BID) | ORAL | 6 refills | Status: DC

## 2023-03-08 MED ORDER — EMPAGLIFLOZIN 10 MG PO TABS: 10.0000 mg | ORAL_TABLET | Freq: Every day | ORAL | 11 refills | Status: DC

## 2023-03-08 MED ORDER — SACUBITRIL-VALSARTAN 97-103 MG PO TABS
1.0000 | ORAL_TABLET | Freq: Two times a day (BID) | ORAL | 3 refills | Status: DC
Start: 2023-03-08 — End: 2023-04-02

## 2023-03-08 NOTE — Progress Notes (Signed)
   ReDS Vest / Clip - 03/08/23 1100       ReDS Vest / Clip   Station Marker D    Ruler Value 32    ReDS Value Range High volume overload    ReDS Actual Value 41

## 2023-03-08 NOTE — Progress Notes (Signed)
ADVANCED HF CLINIC NOTE  Referring Physician: Chilton Si, MD Primary Care: Patient, No Pcp Per Primary Cardiologist: Chilton Si, MD  HPI:  Alex Lee is a 47 y.o.. male with HTN, tobacco use and systolic HF due to NICM.  Presented June 2021 with acute HF.  Echo EF 25-30% with G2DD.  Coronary CTA was negative for CAD; calcium score of 0. Negative sleep study 11/21. He saw Dr. Duke Salvia in September 2021.  Echo 09/21 showed no improvement in his EF.   Bedside echo 3/22 EF 55%. cMRI 08/22 with LVEF 45%, RVEF 52%, versy small area of subendocardial LGE in basal inferoseptal wall (can be seen in sarcoidosis but very small).   Saw Dr. Duke Salvia, 11/22. Off all meds. Restarted carvedilol and spironolactone. Referred back to [facility] to assess whether or not further workup needed to r/o sarcoidosis.  Had repeat cMRI 5/23 EF 44% Very small, well-circumscribed area of subendocardial LGE in the basal inferoseptal wall. No change. Studies were reviewed by Dr. Gala Romney, who felt small area of LGE may be possible old isolated sarcoid lesion but no evidence of active disease or other pathology. Felt no role for biopsy or PET scan. Last seen in the [facility] 11/23.   He returns today for f/u. Feeling "fair". Notes some fatigue. 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 Reports stable NYHA Class II symptoms. No significant wt gain since last visit. Denies orthopnea/PND. No LEE. Denies CP. BP 136/94. Ran out of spiro about 3 months ago. Did not request refills. Reports full compliance w/ Coreg and Entresto but about to run out. Still smoking on average 1/2 ppd. ReDs elevated 41%.   EKG NSR76 bpm, LAFB (personally reviewed)   No significant FHx HF   Cardiac Studies: - Echo (6/21): EF 25-30% G2DD - CTA (6/21): negative for CAD - Echo (9/21): EF 25-30% - Bedside echo (3/22) in clinic EF 55% (EF fully recovered)  - cMRI (02/21/21): EF 45% RVEF 52% small area of LGE in  basilar inferoseptal wall  - cMRI (5/23): 5/23 EF 44% Very small, well-circumscribed area of subendocardial LGE in the basal inferoseptal wall.  Past Medical History:  Diagnosis Date   CHF (congestive heart failure) (HCC)    Childhood asthma    Essential hypertension 03/03/2020   Snoring 03/03/2020   Tobacco abuse    Tobacco abuse 04/08/2020   Current Outpatient Medications  Medication Sig Dispense Refill   albuterol (VENTOLIN HFA) 108 (90 Base) MCG/ACT inhaler Inhale 2 puffs into the lungs every 6 (six) hours as needed for wheezing or shortness of breath. 18 g 0   empagliflozin (JARDIANCE) 10 MG TABS tablet Take 1 tablet (10 mg total) by mouth daily before breakfast. 30 tablet 11   carvedilol (COREG) 12.5 MG tablet Take 1 tablet (12.5 mg total) by mouth 2 (two) times daily with a meal. 60 tablet 6   sacubitril-valsartan (ENTRESTO) 97-103 MG Take 1 tablet by mouth 2 (two) times daily. 180 tablet 3   spironolactone (ALDACTONE) 25 MG tablet Take 0.5 tablets (12.5 mg total) by mouth daily. 15 tablet 5   No current facility-administered medications for this encounter.    No Known Allergies    Social History   Socioeconomic History   Marital status: Married    Spouse name: Not on file   Number of children: Not on file   Years of education: Not on file   Highest education level: Not on file  Occupational History  Not on file  Tobacco Use   Smoking status: Every Day    Current packs/day: 0.50    Types: Cigarettes   Smokeless tobacco: Never  Substance and Sexual Activity   Alcohol use: Yes    Alcohol/week: 10.0 standard drinks of alcohol    Types: 10 Cans of beer per week   Drug use: Not Currently   Sexual activity: Not on file  Other Topics Concern   Not on file  Social History Narrative   Not on file   Social Determinants of Health   Financial Resource Strain: Not on file  Food Insecurity: Not on file  Transportation Needs: Not on file  Physical Activity:  Insufficiently Active (03/24/2020)   Received from [facility]   Exercise Vital Sign    Days of Exercise per Week: 7 days    Minutes of Exercise per Session: 20 min  Stress: Not on file  Social Connections: Unknown (11/14/2021)   Received from [facility]   Social Network    Social Network: Not on file  Intimate Partner Violence: Unknown (10/14/2021)   Received from [facility]   HITS    Physically Hurt: Not on file    Insult or Talk Down To: Not on file    Threaten Physical Harm: Not on file    Scream or Curse: Not on file   Family History  Problem Relation Age of Onset   Hypertension Mother    Hypertension Father    Wt Readings from Last 3 Encounters:  03/08/23 89.9 kg (198 lb 3.2 oz)  05/10/22 88.8 kg (195 lb 12.8 oz)  08/05/21 89.5 kg (197 lb 6.4 oz)   BP (!) 136/94   Pulse 75   Wt 89.9 kg (198 lb 3.2 oz)   SpO2 97%   BMI 31.04 kg/m   PHYSICAL EXAM: ReDs 41%  General:  Well appearing. No respiratory difficulty HEENT: normal Neck: supple. Thick neck, JVD not well visualized. Carotids 2+ bilat; no bruits. No lymphadenopathy or thyromegaly appreciated. Cor: PMI nondisplaced. Regular rate & rhythm. No rubs, gallops or murmurs. Lungs: clear Abdomen: soft, nontender, nondistended. No hepatosplenomegaly. No bruits or masses. Good bowel sounds. Extremities: no cyanosis, clubbing, rash, edema Neuro: alert & oriented x 3, cranial nerves grossly intact. moves all 4 extremities w/o difficulty. Affect pleasant.    ASSESSMENT & PLAN:  1. Chronic systolic HF due to NICM - possible HTN CM - Echo 6/21 EF 25-30% G2DD RV ok - CTA 6/21 no CAD - Echo 9/21 EF 25-30% - There is notation of a small lymph node noted on chest CT but no obvious mediastinal lymphadenopathy that raises suspicion for sarcoidosis.   - Bedside echo 3/22 in clinic EF 55% (EF fully recovered)  - cMRI 02/21/21 EF 45% RVEF 52% small area of LGE in basilar inferoseptal wall  - cMRI reviewed and strongly  doubt that LGE represents cardiac sarcoid.  - repeat cMRI 5/23 EF 44% Very small, well-circumscribed area of subendocardial LGE in the basal inferoseptal wall. No change - NYHA II. Mildly volume overloaded on exam. ReDs elevated at 41%, in setting of poor med compliance.  - Continue Entresto 97/103 mg bid. - Continue carvedilol 12.5 mg bid. - Restart spiro 12.5 mg daily  - Restart Jardiance 10 mg daily  - BMP and BNP today. Repeat BMP in 1 wk - f/u PharmD in 3 wks for further med titration. Encouraged better med compliance   - In reviewing MRIs, defect is in an area c/w  sarcoid but it is small and completely stable. May have old, isolated sarcoid lesion but no evidence of active disease or other pathology. No role for biopsy or PET scan at this time   2. Essential HTN - moderately elevated, in setting of poor med compliance  - Med changes as above - f/u w/ PharmD in 3 wks for further med titration  - BMP today   3. Tobacco use - smokes 1/2 ppd   - Encouraged cessation  4. Snoring  - sleep study 11/21 AHI 3.4 (normal)   F/u w/ PharmD in 3 wks and APP in 6 wks.   Lajean Boese, PA-C  11:59 AM

## 2023-03-08 NOTE — Patient Instructions (Addendum)
EKG done today.  RedsClip done today.  Labs done today. We will contact you only if your labs are abnormal.  RESTART Spironolactone 12.5mg  (1/2 tablet) by mouth daily.  RESTART Jardiance 10mg  (1 tablet) by mouth daily.  No other medication changes were made. Please continue all current medications as prescribed.  Your physician recommends that you schedule a follow-up appointment in: 1 week  for a lab only appointment, 3 weeks with our Clinic Pharmacist and in 6 weeks with our NP/PA Clinic.   If you have any questions or concerns before your next appointment please send [place] a message through [place] or call our office at [phone].    TO LEAVE A MESSAGE FOR THE NURSE SELECT OPTION 2, PLEASE LEAVE A MESSAGE INCLUDING: YOUR NAME DATE OF BIRTH CALL BACK NUMBER REASON FOR CALL**this is important as we prioritize the call backs  YOU WILL RECEIVE A CALL BACK THE SAME DAY AS LONG AS YOU CALL BEFORE 4:00 PM   Do the following things EVERYDAY: Weigh yourself in the morning before breakfast. Write it down and keep it in a log. Take your medicines as prescribed Eat low salt foods--Limit salt (sodium) to 2000 mg per day.  Stay as active as you can everyday Limit all fluids for the day to less than 2 liters   At the [facility] Clinic, you and your health needs are our priority. As part of our continuing mission to provide you with exceptional heart care, we have created designated Provider Care Teams. These Care Teams include your primary Cardiologist (physician) and Advanced Practice Providers (APPs- Physician Assistants and Nurse Practitioners) who all work together to provide you with the care you need, when you need it.   You may see any of the following providers on your designated Care Team at your next follow up: Dr Arvilla Meres Dr Marca Ancona Dr. Marcos Eke, NP Robbie Lis, [place] [facility] [place], [place] Brynda Peon, NP Karle Plumber, PharmD   Please be sure to bring in all your medications bottles to every appointment.    Thank you for choosing  [facility]

## 2023-03-13 ENCOUNTER — Telehealth (HOSPITAL_BASED_OUTPATIENT_CLINIC_OR_DEPARTMENT_OTHER): Payer: Self-pay | Admitting: Cardiovascular Disease

## 2023-03-13 NOTE — Telephone Encounter (Signed)
Returned call to patient,   Spoke with patient and wife, new application for [employer]. Wife will come get application and fill out for renewal.   Application left at front desk.

## 2023-03-13 NOTE — Telephone Encounter (Signed)
New Message:     Patient's wife called. She said she needs to talk to dr Leonides Sake nurse. She says  it is time to renew his Application to get patient's assistance with his Entresto. She wants  to know what does he need to do?

## 2023-03-15 ENCOUNTER — Other Ambulatory Visit (HOSPITAL_COMMUNITY): Payer: 59

## 2023-03-27 ENCOUNTER — Telehealth (HOSPITAL_COMMUNITY): Payer: Self-pay

## 2023-03-27 ENCOUNTER — Other Ambulatory Visit (HOSPITAL_COMMUNITY): Payer: Self-pay

## 2023-03-27 NOTE — Telephone Encounter (Signed)
Patient Advocate Encounter  Contacted patient about [employer] window. Review of patients chart shows active insurance coverage with [employer], however patient states that there is no active insurance. His wife has already started the process of applying for renewal.  Informed pt to call me if they have any questions or issues with the application process, patient agreed.  Burnell Blanks, CPhT Rx Patient Advocate Phone: [phone]

## 2023-03-30 ENCOUNTER — Telehealth: Payer: Self-pay | Admitting: Cardiovascular Disease

## 2023-03-30 NOTE — Telephone Encounter (Signed)
Left message to call back

## 2023-03-30 NOTE — Telephone Encounter (Signed)
Pt's wife would like a callback regarding Pt Assistance paperwork. Please advise

## 2023-04-02 MED ORDER — SACUBITRIL-VALSARTAN 97-103 MG PO TABS: 1.0000 | ORAL_TABLET | Freq: Two times a day (BID) | ORAL | 3 refills | Status: DC

## 2023-04-02 NOTE — Telephone Encounter (Signed)
Spoke with wife regarding patient assistance for Entrest They have faxed patient portion just need physician portion faxed  Filled out and awaiting Dr Duke Salvia signature

## 2023-04-02 NOTE — Telephone Encounter (Signed)
Patient's wife returned call.

## 2023-04-06 ENCOUNTER — Telehealth: Payer: Self-pay | Admitting: Cardiovascular Disease

## 2023-04-06 NOTE — Telephone Encounter (Signed)
Pt's wife states that she would like to speak with Dr. Leonides Sake nurse about getting pt assistance. Please advise

## 2023-04-06 NOTE — Telephone Encounter (Signed)
Left message to call back and sent below mychart message  Hello Ms Eisemann   I tried to call [employer] to follow up on status of the Entresto. Unfortunately they have to verify the address of patient for HIPAA. The address we have [address] [place]  is not what they have listed so they would not share any information. I was not able to send until yesterday secondary to Dr Duke Salvia only signing the one place instead of both places. I did leave you a message for you to call back [phone]. If you send me the updated address I can call them with update or if it is after 5 you can call them at [phone]   [facility]

## 2023-04-09 ENCOUNTER — Other Ambulatory Visit (HOSPITAL_COMMUNITY): Payer: Self-pay

## 2023-04-09 ENCOUNTER — Inpatient Hospital Stay (HOSPITAL_COMMUNITY): Admission: RE | Admit: 2023-04-09 | Payer: 59 | Source: Ambulatory Visit

## 2023-04-09 NOTE — Telephone Encounter (Signed)
Received notification from [employer] that patients income was missing and that signature on form from Dr Duke Salvia needed Form was signed, updated on new patient assistance form and faxed  [place] message sent to patient

## 2023-04-11 NOTE — Telephone Encounter (Signed)
Received notification from [employer] patient approved through 04/10/2024   M[place]art message to patient

## 2023-04-11 NOTE — Telephone Encounter (Signed)
Received notification from [employer] patient approved through 04/10/2024  Mychart message to patient

## 2023-04-18 ENCOUNTER — Telehealth (HOSPITAL_COMMUNITY): Payer: Self-pay

## 2023-04-18 NOTE — Telephone Encounter (Signed)
Called and was unable to leave a voice message to confirm/remind patient of their appointment at the [facility] on 04/19/23.

## 2023-04-19 ENCOUNTER — Encounter (HOSPITAL_COMMUNITY): Payer: 59

## 2024-04-12 ENCOUNTER — Other Ambulatory Visit (HOSPITAL_COMMUNITY): Payer: Self-pay | Admitting: Cardiology

## 2024-04-14 NOTE — Progress Notes (Deleted)
 Cardiology Office Note:    Date:  04/14/2024   ID:  Alex Lee, DOB 1975/10/07, MRN [id number]  PCP:  Patient, No Pcp Per  Cardiologist:  Annabella Scarce, MD { Click to update primary MD,subspecialty MD or APP then REFRESH:1}    Referring MD: No ref. provider found   Chief Complaint: follow-up of CHF  History of Present Illness:    Alex Lee is a 48 y.o. male with a history of coronary CTA in 12/2019, non-ischemic cardiomyopathy/ chronic HFrEF  with EF of 25-30% in Echo in 2021 but 44% on cardiac MRI in 11/2021, hypertension, and tobacco abuse who is followed by Dr. Scarce and Dr. Cherrie and presents today for routine follow-up.  Patient was admitted in 12/2019 for acute CHF. Echo showed LVEF of 25-30% with diffuse hypokinesis and grade 2 diastolic dysfunction, normal RV function with normal PASP, and mild to moderate AI. Coronary CTA showed a coronary calcium score of 0 with no evidence of CAD. Repeat Echo in 03/2020 showed persistent low EF of 25-30% with global hypokinesis, normal RV function, with no significant valvular disease. Renal artery ultrasound in 04/2020 showed no evidence of renal artery disease. Cardiac MRI in 02/2021 showed mildly dilated LV with mild LVH and EF of 45% with basal septal hypokinesis as well as a very small, well-circumscribed area of subendocardial LGE in the basal inferoseptal wall (nodular LGE in the basal septal wall can be seen in cardiac sarcoidosis though lesion was very small). Repeat cardiac MRI in 11/2021 showed LVEF of 44%, RVEF of 52%, and continued small area of LGE unchanged from prior imaging. Imaging was reviewed with Dr. Bensimhon and not felt to be suggestive of amyloidosis or sarcoidosis.   Patient was last seen by Caffie Shed, PA-C, in 02/2023 at which time he was feeling fair. He reported some fatigue but had stable NYHA Class II symptoms. He had run out of Spironolactone  about 6 weeks prior. He was restarted on Spironolactone  and  Jardiance . He was advised to follow-up in 3 weeks with PharmD and 6 weeks with APP but was lost to follow-up.  Patient presents today for follow-up. ***  Chronic HFrEF Non-Ischemic Cardiomyopathy Initially diagnosed in 12/2019 when found to hav EF of 25-30%. Coronary CTA at that time showed normal coronaries. Cardiac MRI in 02/2021 showed mildly dilated LV with mild LVH and EF of 45% with basal septal hypokinesis as well as a very small, well-circumscribed area of subendocardial LGE in the basal inferoseptal wall (nodular LGE in the basal septal wall can be seen in cardiac sarcoidosis though lesion was very small). Repeat cardiac MRI in 11/2021 showed LVEF of 44%, RVEF of 52%, and continued small area of LGE unchanged from prior imaging.  - Continue Entresto  97-103mg  twice daily.  - Continue Coreg  12.5mg  twice daily.  - Continue Spironolactone  12.5mg  daily.  - Continue Jardiance  10mg  daily.  - Discussed importance of the daily weights and sodium/ fluid restrictions.  - Per Dr. 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 May have old, isolated sarcoid lesion but no evidence of active disease or other pathology. There was not felt to be a role for biopsy or PET scan in 2023.   Hypertension BP *** - Continue medications for CHF as above.  Tobacco Abuse ***   EKGs/Labs/Other Studies Reviewed:    The following studies were reviewed:  Echocardiogram 12/28/2019: Impressions: 1. Severely reduced LVEF 20-25% with diffuse  hypokinesis, mildly dilated  left ventricle and left atrium, grade 2 diastolic dysfunction with  elevated filling pressures. RVEF appears normal.   2. Left ventricular ejection fraction, by estimation, is 20 to 25%. The  left ventricle has severely decreased function. The left ventricle  demonstrates global hypokinesis. The left ventricular internal cavity size  was mildly dilated. Left  ventricular  diastolic parameters are consistent with Grade II diastolic dysfunction  (pseudonormalization). Elevated left ventricular end-diastolic pressure.   3. Right ventricular systolic function is normal. The right ventricular  size is normal. There is normal pulmonary artery systolic pressure.   4. Left atrial size was mildly dilated.   5. The mitral valve is normal in structure. No evidence of mitral valve  regurgitation. No evidence of mitral stenosis.   6. The aortic valve is normal in structure. Aortic valve regurgitation is  mild. Mild to moderate aortic valve sclerosis/calcification is present,  without any evidence of aortic stenosis.   7. The inferior vena cava is normal in size with greater than 50%  respiratory variability, suggesting right atrial pressure of 3 mmHg.  _______________  Coronary CTA 12/29/2019: Impressions: 1. Coronary calcium score of 0. This was 0 percentile for age and sex matched control. 2. Normal coronary origin with right dominance. 3. No evidence of CAD. _______________  Echocardiogram 03/31/2020: Impressions: 1. Left ventricular ejection fraction, by estimation, is 25 to 30%. The  left ventricle has severely decreased function. The left ventricle  demonstrates global hypokinesis. Left ventricular diastolic parameters are  consistent with Grade II diastolic  dysfunction (pseudonormalization).   2. Right ventricular systolic function is normal. The right ventricular  size is normal. Tricuspid regurgitation signal is inadequate for assessing  PA pressure.   3. Left atrial size was mildly dilated.   4. The mitral valve is normal in structure. No evidence of mitral valve  regurgitation. No evidence of mitral stenosis.   5. The aortic valve is tricuspid. Aortic valve regurgitation is trivial.  No aortic stenosis is present.   6. The inferior vena cava is normal in size with greater than 50%  respiratory variability, suggesting right atrial  pressure of 3 mmHg.  _______________  Renal Artery Ultrasound 04/13/2020: Summary:  - Largest Aortic Diameter: 2.1 cm    Renal:  - Right: Normal size right kidney. Normal right Resisitive Index. Normal cortical thickness of right kidney. No evidence of right renal artery stenosis. RRV flow present.  - Left:  Normal size of left kidney. Normal left Resistive Index. Normal cortical thickness of the left kidney. No evidence of  left renal artery stenosis. LRV flow present.   Mesenteric:  - Normal Celiac artery and Superior Mesenteric artery findings.  _______________  Cardiac MRI 02/21/2021: Impression: 1. Mildly dilated LV with mild LV hypertrophy. Basal septal hypokinesis with EF 45%. 2.  Normal RV size and systolic function, EF 52%. 3. Very small, well-circumscribed area of subendocardial LGE in the basal inferoseptal wall. This is not suggestive of coronary disease. Can see nodular-type LGE in the basal septal wall in cardiac sarcoidosis though the lesion seen is very small. _______________  Cardiac MRI 11/18/2021: Impression: 1.  Normal LV size with basal septal hypokinesis, EF 44%. 2.  Normal RV size with EF 52%. 3. Very small, well-circumscribed area of subendocardial LGE in the basal inferoseptal wall. This is not suggestive of coronary disease. Can see nodular-type LGE in the basal septal wall in cardiac sarcoidosis though the lesion seen is very small. It is unchanged compared to  prior MRI. 4. Elevated extracellular volume percentage in the septum, not high enough to suggest cardiac amyloidosis.  EKG:  EKG ordered today. EKG personally reviewed and demonstrates ***.  Recent Labs: No results found for requested labs within last 365 days.  Recent Lipid Panel    Component Value Date/Time   CHOL 191 12/29/2019 0417   TRIG 84 12/29/2019 0417   HDL 33 (L) 12/29/2019 0417   CHOLHDL 5.8 12/29/2019 0417   VLDL 17 12/29/2019 0417   LDLCALC 141 (H) 12/29/2019 0417     Physical Exam:    Vital Signs: There were no vitals taken for this visit.    Wt Readings from Last 3 Encounters:  03/08/23 198 lb 3.2 oz (89.9 kg)  05/10/22 195 lb 12.8 oz (88.8 kg)  08/05/21 197 lb 6.4 oz (89.5 kg)     General: 48 y.o. male in no acute distress. HEENT: Normocephalic and atraumatic. Sclera clear.  Neck: Supple. No carotid bruits. No JVD. Heart: *** RRR. Distinct S1 and S2. No murmurs, gallops, or rubs.  Lungs: No increased work of breathing. Clear to ausculation bilaterally. No wheezes, rhonchi, or rales.  Abdomen: Soft, non-distended, and non-tender to palpation.  Extremities: No lower extremity edema.  Radial and distal pedal pulses 2+ and equal bilaterally. Skin: Warm and dry. Neuro: No focal deficits. Psych: Normal affect. Responds appropriately.   Assessment:    No diagnosis found.  Plan:     Disposition: Follow up in ***   Signed, Aline FORBES Door, PA-C  04/14/2024 5:23 PM    [facility]

## 2024-04-15 ENCOUNTER — Ambulatory Visit: Admitting: Student

## 2024-04-30 ENCOUNTER — Telehealth: Payer: Self-pay

## 2024-04-30 ENCOUNTER — Encounter: Payer: Self-pay | Admitting: Family Medicine

## 2024-04-30 NOTE — Progress Notes (Signed)
 Out of all medications. Has not been seen by your cardiologist in over a year. BP is elevated- he needs in person to properly assess and maybe get labs given CHF Dx and HTN.  Patient acknowledged agreement and understanding of the plan.

## 2024-05-15 ENCOUNTER — Telehealth (HOSPITAL_COMMUNITY): Payer: Self-pay

## 2024-05-15 NOTE — Telephone Encounter (Signed)
 Called to confirm/remind patient of their appointment at the [facility] on 05/16/24.   Appointment:   [x] Confirmed  [] Left mess   [] No answer/No voice mail  [] VM Full/unable to leave message  [] Phone not in service  Patient reminded to bring all medications and/or complete list.  Confirmed patient has transportation. Gave directions, instructed to utilize valet parking.

## 2024-05-16 ENCOUNTER — Encounter (HOSPITAL_COMMUNITY): Payer: Self-pay

## 2024-05-16 ENCOUNTER — Ambulatory Visit (HOSPITAL_COMMUNITY): Payer: Self-pay | Admitting: Cardiology

## 2024-05-16 ENCOUNTER — Ambulatory Visit (HOSPITAL_COMMUNITY)
Admission: RE | Admit: 2024-05-16 | Discharge: 2024-05-16 | Disposition: A | Discharge status: Home / Self Care | Source: Ambulatory Visit | Attending: Cardiology | Admitting: Cardiology

## 2024-05-16 ENCOUNTER — Other Ambulatory Visit (HOSPITAL_COMMUNITY): Payer: Self-pay

## 2024-05-16 ENCOUNTER — Telehealth (HOSPITAL_COMMUNITY): Payer: Self-pay

## 2024-05-16 VITALS — BP 162/102 | HR 82 | Wt 207.2 lb

## 2024-05-16 DIAGNOSIS — I5042 Chronic combined systolic (congestive) and diastolic (congestive) heart failure: Secondary | ICD-10-CM | POA: Diagnosis not present

## 2024-05-16 DIAGNOSIS — R0683 Snoring: Secondary | ICD-10-CM | POA: Diagnosis not present

## 2024-05-16 DIAGNOSIS — E8770 Fluid overload, unspecified: Secondary | ICD-10-CM | POA: Diagnosis not present

## 2024-05-16 DIAGNOSIS — Z716 Tobacco abuse counseling: Secondary | ICD-10-CM | POA: Diagnosis not present

## 2024-05-16 DIAGNOSIS — I428 Other cardiomyopathies: Secondary | ICD-10-CM | POA: Insufficient documentation

## 2024-05-16 DIAGNOSIS — Z79899 Other long term (current) drug therapy: Secondary | ICD-10-CM | POA: Insufficient documentation

## 2024-05-16 DIAGNOSIS — D869 Sarcoidosis, unspecified: Secondary | ICD-10-CM | POA: Insufficient documentation

## 2024-05-16 DIAGNOSIS — F17210 Nicotine dependence, cigarettes, uncomplicated: Secondary | ICD-10-CM | POA: Diagnosis not present

## 2024-05-16 DIAGNOSIS — I5022 Chronic systolic (congestive) heart failure: Secondary | ICD-10-CM | POA: Diagnosis present

## 2024-05-16 DIAGNOSIS — I110 Hypertensive heart disease with heart failure: Secondary | ICD-10-CM | POA: Insufficient documentation

## 2024-05-16 LAB — BASIC METABOLIC PANEL WITH GFR
Anion gap: 9 (ref 5–15)
BUN: 12 mg/dL (ref 6–20)
CO2: 25 mmol/L (ref 22–32)
Calcium: 8.6 mg/dL — ABNORMAL LOW (ref 8.9–10.3)
Chloride: 104 mmol/L (ref 98–111)
Creatinine, Ser: 0.94 mg/dL (ref 0.61–1.24)
GFR, Estimated: >60 mL/min (ref >60)
Glucose, Bld: 117 mg/dL — ABNORMAL HIGH (ref 70–99)
Potassium: 3.7 mmol/L (ref 3.5–5.1)
Sodium: 138 mmol/L (ref 135–145)

## 2024-05-16 LAB — BRAIN NATRIURETIC PEPTIDE: B Natriuretic Peptide: 24.5 pg/mL (ref 0.0–100.0)

## 2024-05-16 MED ORDER — LOSARTAN POTASSIUM 25 MG PO TABS: 25.0000 mg | ORAL_TABLET | Freq: Every day | ORAL | 3 refills | Status: AC

## 2024-05-16 MED ORDER — SPIRONOLACTONE 25 MG PO TABS: 12.5000 mg | ORAL_TABLET | Freq: Every day | ORAL | 1 refills | Status: DC

## 2024-05-16 MED ORDER — FUROSEMIDE 40 MG PO TABS: 40.0000 mg | ORAL_TABLET | Freq: Every day | ORAL | 2 refills | Status: DC

## 2024-05-16 NOTE — Progress Notes (Addendum)
 ADV[facility]ED [facility] CLINIC NOTE  Referring Physician: Annabella Scarce, MD Primary Care: Patient, No Pcp Per Primary Cardiologist: Annabella Scarce, MD  HPI:  Alex Lee is a 48 y.o. male with HTN, tobacco use and systolic HF due to NICM.  Presented June 2021 with acute HF.  Echo EF 25-30% with G2DD.  Coronary CTA was negative for CAD; calcium score of 0. Negative sleep study 11/21. He saw Dr. Scarce in September 2021.  Echo 09/21 showed no improvement in his EF.   Bedside echo 3/22 EF 55%. cMRI 08/22 with LVEF 45%, RVEF 52%, versy small area of subendocardial LGE in basal inferoseptal wall (can be seen in sarcoidosis but very small).   Saw Dr. Scarce, 11/22, Off all meds. Restarted carvedilol  and spironolactone . Referred back to [facility] to assess whether or not further workup needed to r/o sarcoidosis.  Had repeat cMRI 5/23 EF 44% Very small, well-circumscribed area of subendocardial LGE in the basal inferoseptal wall. No change. Studies were reviewed by Dr. Cherrie, who felt small area of LGE may be possible old isolated sarcoid lesion but no evidence of active disease or other pathology. Felt no role for biopsy or PET scan.   Hasn't been seen in over a year. Lasts OV 8/24. He returns today for f/u. Not taking meds. Ran out. BP elevated, 162/102. Reports NYHA Class II symptoms. No CP. ReDs elevated at 41%. Still smoking on average 1/2 ppd.   ReDs  41%, abnormal    EKG NSR 75 bpm (personally reviewed)   No significant FHx HF   Cardiac Studies: - Echo (6/21): EF 25-30% G2DD - CTA (6/21): negative for CAD - Echo (9/21): EF 25-30% - Bedside echo (3/22) in clinic EF 55% (EF fully recovered)  - cMRI (02/21/21): EF 45% RVEF 52% small area of LGE in basilar inferoseptal wall  - cMRI (5/23): 5/23 EF 44% Very small, well-circumscribed area of subendocardial LGE in the basal inferoseptal wall.  Past Medical History:  Diagnosis Date   CHF (congestive heart failure) (HCC)    Childhood  asthma    Essential hypertension 03/03/2020   Snoring 03/03/2020   Tobacco abuse    Tobacco abuse 04/08/2020   Current Outpatient Medications  Medication Sig Dispense Refill   albuterol  (VENTOLIN  HFA) 108 (90 Base) MCG/ACT inhaler Inhale 2 puffs into the lungs every 6 (six) hours as needed for wheezing or shortness of breath. 18 g 0   carvedilol  (COREG ) 12.5 MG tablet Take 1 tablet (12.5 mg total) by mouth 2 (two) times daily with a meal. (Patient not taking: Reported on 05/16/2024) 60 tablet 6   empagliflozin  (JARDIANCE ) 10 MG TABS tablet Take 1 tablet (10 mg total) by mouth daily before breakfast. (Patient not taking: Reported on 05/16/2024) 30 tablet 11   sacubitril -valsartan  (ENTRESTO ) 97-103 MG Take 1 tablet by mouth 2 (two) times daily. (Patient not taking: Reported on 05/16/2024) 180 tablet 3   spironolactone  (ALDACTONE ) 25 MG tablet Take 0.5 tablets (12.5 mg total) by mouth daily. (Patient not taking: Reported on 05/16/2024) 15 tablet 5   No current facility-administered medications for this encounter.    No Known Allergies    Social History   Socioeconomic History   Marital status: Married    Spouse name: Not on file   Number of children: Not on file   Years of education: Not on file   Highest education level: Not on file  Occupational History   Not on file  Tobacco Use   Smoking status: Every  Day    Current packs/day: 0.50    Types: Cigarettes   Smokeless tobacco: Never  Substance and Sexual Activity   Alcohol use: Yes    Alcohol/week: 10.0 standard drinks of alcohol    Types: 10 Cans of beer per week   Drug use: Not Currently   Sexual activity: Not on file  Other Topics Concern   Not on file  Social History Narrative   Not on file   Social Drivers of Health   Financial Resource Strain: Not on file  Food Insecurity: Not on file  Transportation Needs: Not on file  Physical Activity: Insufficiently Active (03/24/2020)   Received from [facility]   Exercise  Vital Sign    On average, how many days per week do you engage in moderate to strenuous exercise (like a brisk walk)?: 7 days    On average, how many minutes do you engage in exercise at this level?: 20 min  Stress: Not on file  Social Connections: Unknown (11/14/2021)   Received from [facility]   Social Network    Social Network: Not on file  Intimate Partner Violence: Unknown (10/14/2021)   Received from [facility]   HITS    Physically Hurt: Not on file    Insult or Talk Down To: Not on file    Threaten Physical Harm: Not on file    Scream or Curse: Not on file   Family History  Problem Relation Age of Onset   Hypertension Mother    Hypertension Father    Wt Readings from Last 3 Encounters:  05/16/24 94 kg (207 lb 3.2 oz)  03/08/23 89.9 kg (198 lb 3.2 oz)  05/10/22 88.8 kg (195 lb 12.8 oz)   BP (!) 162/102   Pulse 82   Wt 94 kg (207 lb 3.2 oz)   SpO2 97%   BMI 32.45 kg/m   PHYSICAL EXAM: ReDs 41%  General:  Well appearing. No respiratory difficulty HEENT: normal Neck: supple. Thick neck, JVD not well visualized. Carotids 2+ bilat; no bruits. No lymphadenopathy or thyromegaly appreciated. Cor: PMI nondisplaced. Regular rate & rhythm. No rubs, gallops or murmurs. Lungs: clear Abdomen: soft, nontender, nondistended. No hepatosplenomegaly. No bruits or masses. Good bowel sounds. Extremities: no cyanosis, clubbing, rash, edema Neuro: alert & oriented x 3, cranial nerves grossly intact. moves all 4 extremities w/o difficulty. Affect pleasant.    ASSESSMENT & PLAN:  1. Chronic systolic HF due to NICM - possible HTN CM - Echo 6/21 EF 25-30% G2DD RV ok - CTA 6/21 no CAD - Echo 9/21 EF 25-30% - There is notation of a small lymph node noted on chest CT but no obvious mediastinal lymphadenopathy that raises suspicion for sarcoidosis.   - Bedside echo 3/22 in clinic EF 55% (EF fully recovered)  - cMRI 02/21/21 EF 45% RVEF 52% small area of LGE in basilar inferoseptal wall   - cMRI reviewed and strongly doubt that LGE represents cardiac sarcoid.  - repeat cMRI 5/23 EF 44% Very small, well-circumscribed area of subendocardial LGE in the basal inferoseptal wall. 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 May have old, isolated sarcoid lesion but no evidence of active disease or other pathology. No role for biopsy or PET scan at this time  - lost to f/u. Not seen in over a year. NYHA Class II. Volume overloaded on exam and by ReDs 41%. Off meds and hypertensive  -  Plan to update echo  - Start Lasix 20 mg daily  - Start Losartan  25 mg daily (Entresto  too expensive) - Start Spiro 12.5 mg daily  - Plan to add back Coreg  next visit after he is diuresed - would like to get back on SGLT2i if can qualify for patient assistance. Have messaged pharmacy team to look at options - discussed importance of maintaining strict compliance  - f/u in 2-3 wks to further titrate meds    2. HTN - elevated in setting of poor compliance  - add back GDMT per above - BMP today  - f/u w/ PharmD for further up titration in 2-3 wks   3. Tobacco use - smokes 1/2 ppd   - Encouraged cessation  4. Snoring  - sleep study 11/21 AHI 3.4 (normal)   F/u w/ PharmD in 3 wks and APP in 6 wks.     Mayank Teuscher, PA-C  10:10 AM

## 2024-05-16 NOTE — Progress Notes (Signed)
 ReDS Vest / Clip - 05/16/24 1000       ReDS Vest / Clip   Station Marker D    Ruler Value 32    ReDS Value Range High volume overload    ReDS Actual Value 41

## 2024-05-16 NOTE — Addendum Note (Signed)
 Encounter addended by: Marcine Caffie HERO, PA-C on: 05/16/2024 1:25 PM  Actions taken: Clinical Note Signed

## 2024-05-16 NOTE — Patient Instructions (Addendum)
 Good to see you today!  START Lasix 40 mg daily  Start spironolactone  12.5 mg (1/2) tablet) daily  STOP Entresto   START Losartan  25 mg daily  Labs done today, your results will be available in MyChart, we will contact you for abnormal readings.   Your physician has requested that you have an echocardiogram. Echocardiography is a painless test that uses sound waves to create images of your heart. It provides your doctor with information about the size and shape of your heart and how well your heart's chambers and valves are working. This procedure takes approximately one hour. There are no restrictions for this procedure. Please do NOT wear cologne, perfume, aftershave, or lotions (deodorant is allowed). Please arrive 15 minutes prior to your appointment time.  Please note: We ask at that you not bring children with you during ultrasound (echo/ vascular) testing. Due to room size and safety concerns, children are not allowed in the ultrasound rooms during exams. Our front office staff cannot provide observation of children in our lobby area while testing is being conducted. An adult accompanying a patient to their appointment will only be allowed in the ultrasound room at the discretion of the ultrasound technician under special circumstances. We apologize for any inconvenience.  Your physician recommends that you schedule a follow-up appointment as scheduled  If you have any questions or concerns before your next appointment please send us  a message through [place] or call our office at [phone].    TO LEAVE A MESSAGE FOR THE NURSE SELECT OPTION 2, PLEASE LEAVE A MESSAGE INCLUDING: YOUR NAME DATE OF BIRTH CALL BACK NUMBER REASON FOR CALL**this is important as we prioritize the call backs  YOU WILL RECEIVE A CALL BACK THE SAME DAY AS LONG AS YOU CALL BEFORE 4:00 PM At the [facility], you and your health needs are our priority. As part of our continuing mission to  provide you with exceptional heart care, we have created designated Provider Care Teams. These Care Teams include your primary Cardiologist (physician) and Advanced Practice Providers (APPs- Physician Assistants and Nurse Practitioners) who all work together to provide you with the care you need, when you need it.   You may see any of the following providers on your designated Care Team at your next follow up: Dr Toribio Fuel Dr Ezra Shuck Dr. Morene Brownie Greig Mosses, NP Caffie Shed, [place] [facility] [place], [place] Beckey Coe, NP Jordan Lee, NP Ellouise Class, NP Tinnie Redman, PharmD Jaun Bash, PharmD   Please be sure to bring in all your medications bottles to every appointment.    Thank you for choosing [facility]

## 2024-05-19 ENCOUNTER — Other Ambulatory Visit (HOSPITAL_COMMUNITY): Payer: Self-pay

## 2024-05-19 NOTE — Telephone Encounter (Signed)
 Advanced Heart Failure Patient Advocate Encounter  Prior authorization for Sacubitril -Valsartan  97/103 MG has been submitted and approved. Test billing returns [phone] for 30 day supply. Plan limits 30 days and has an unmet $1650 deductible.  Key: [id number] Effective: 05/16/2024 to 05/16/2025  Rachel DEL, CPhT Rx Patient Advocate Phone: [phone]

## 2024-05-22 ENCOUNTER — Encounter (HOSPITAL_COMMUNITY): Payer: Self-pay

## 2024-05-30 ENCOUNTER — Telehealth (HOSPITAL_COMMUNITY): Payer: Self-pay

## 2024-05-30 NOTE — Telephone Encounter (Signed)
 Called to confirm/remind patient of their appointment at the [facility] on 06/02/24.   Appointment:   [x] Confirmed  [] Left mess   [] No answer/No voice mail  [] VM Full/unable to leave message  [] Phone not in service  Patient reminded to bring all medications and/or complete list.  Confirmed patient has transportation. Gave directions, instructed to utilize valet parking.

## 2024-06-02 ENCOUNTER — Encounter (HOSPITAL_COMMUNITY): Payer: Self-pay

## 2024-06-02 ENCOUNTER — Ambulatory Visit (HOSPITAL_COMMUNITY)
Admission: RE | Admit: 2024-06-02 | Discharge: 2024-06-02 | Disposition: A | Discharge status: Home / Self Care | Source: Ambulatory Visit | Attending: Family Medicine | Admitting: Family Medicine

## 2024-06-02 ENCOUNTER — Ambulatory Visit (HOSPITAL_COMMUNITY): Payer: Self-pay | Admitting: Family Medicine

## 2024-06-02 ENCOUNTER — Other Ambulatory Visit (HOSPITAL_COMMUNITY)

## 2024-06-02 VITALS — BP 154/104 | HR 74 | Ht 67.0 in | Wt 209.4 lb

## 2024-06-02 DIAGNOSIS — F17210 Nicotine dependence, cigarettes, uncomplicated: Secondary | ICD-10-CM | POA: Insufficient documentation

## 2024-06-02 DIAGNOSIS — Z79899 Other long term (current) drug therapy: Secondary | ICD-10-CM | POA: Diagnosis not present

## 2024-06-02 DIAGNOSIS — I428 Other cardiomyopathies: Secondary | ICD-10-CM | POA: Diagnosis not present

## 2024-06-02 DIAGNOSIS — I5042 Chronic combined systolic (congestive) and diastolic (congestive) heart failure: Secondary | ICD-10-CM | POA: Diagnosis present

## 2024-06-02 DIAGNOSIS — I110 Hypertensive heart disease with heart failure: Secondary | ICD-10-CM | POA: Insufficient documentation

## 2024-06-02 DIAGNOSIS — Z720 Tobacco use: Secondary | ICD-10-CM

## 2024-06-02 DIAGNOSIS — I10 Essential (primary) hypertension: Secondary | ICD-10-CM

## 2024-06-02 DIAGNOSIS — I5022 Chronic systolic (congestive) heart failure: Secondary | ICD-10-CM | POA: Insufficient documentation

## 2024-06-02 DIAGNOSIS — R0683 Snoring: Secondary | ICD-10-CM | POA: Insufficient documentation

## 2024-06-02 LAB — BASIC METABOLIC PANEL WITH GFR
Anion gap: 9 (ref 5–15)
BUN: 13 mg/dL (ref 6–20)
CO2: 29 mmol/L (ref 22–32)
Calcium: 9.2 mg/dL (ref 8.9–10.3)
Chloride: 101 mmol/L (ref 98–111)
Creatinine, Ser: 0.85 mg/dL (ref 0.61–1.24)
GFR, Estimated: >60 mL/min (ref >60)
Glucose, Bld: 115 mg/dL — ABNORMAL HIGH (ref 70–99)
Potassium: 3.9 mmol/L (ref 3.5–5.1)
Sodium: 139 mmol/L (ref 135–145)

## 2024-06-02 LAB — BRAIN NATRIURETIC PEPTIDE: B Natriuretic Peptide: 12 pg/mL (ref 0.0–100.0)

## 2024-06-02 MED ORDER — FUROSEMIDE 20 MG PO TABS: 20.0000 mg | ORAL_TABLET | Freq: Every day | ORAL | 1 refills | Status: DC | PRN

## 2024-06-02 MED ORDER — CARVEDILOL 6.25 MG PO TABS: 6.2500 mg | ORAL_TABLET | Freq: Two times a day (BID) | ORAL | 3 refills | Status: DC

## 2024-06-02 MED ORDER — SPIRONOLACTONE 25 MG PO TABS: 25.0000 mg | ORAL_TABLET | Freq: Every day | ORAL | 1 refills | Status: AC

## 2024-06-02 NOTE — Patient Instructions (Signed)
 Medication Changes:  INCREASE SPIRONOLACTONE  TO 25MG  ONCE DAILY   START CARVEDILOL  6.25MG  TWICE DAILY   CHANGE LASIX  (FUROSEMIDE ) T0 20MG  ONCE DAILY AS NEEDED   Lab Work:  Labs done today, your results will be available in MyChart, we will contact you for abnormal readings.  Special Instructions // Education:  CHECK BLOOD PRESSURE DAILY AND WRITE THESE NUMBERS DOWN--PLEASE BRING THIS TO YOUR NEXT APPOINTMENT  Follow-Up in: ON DECEMBER THE 10TH AT 10AM   At the [facility], you and your health needs are our priority. We have a designated team specialized in the treatment of Heart Failure. This Care Team includes your primary Heart Failure Specialized Cardiologist (physician), Advanced Practice Providers (APPs- Physician Assistants and Nurse Practitioners), and Pharmacist who all work together to provide you with the care you need, when you need it.   You may see any of the following providers on your designated Care Team at your next follow up:  Dr. Toribio Fuel Dr. Ezra Shuck Dr. Odis Brownie Greig Mosses, NP Caffie Shed, [place] [facility] [place], [place] Beckey Coe, NP Jordan Lee, NP Tinnie Redman, PharmD   Please be sure to bring in all your medications bottles to every appointment.   Need to Contact Us :  If you have any questions or concerns before your next appointment please send us  a message through [place] or call our office at [phone].    TO LEAVE A MESSAGE FOR THE NURSE SELECT OPTION 2, PLEASE LEAVE A MESSAGE INCLUDING: YOUR NAME DATE OF BIRTH CALL BACK NUMBER REASON FOR CALL**this is important as we prioritize the call backs  YOU WILL RECEIVE A CALL BACK THE SAME DAY AS LONG AS YOU CALL BEFORE 4:00 PM

## 2024-06-02 NOTE — Progress Notes (Signed)
 ADVANCED HF CLINIC NOTE   Primary Care: Patient, No Pcp Per Primary Cardiologist: Annabella Scarce, MD HF Cardiologist: Dr. Cherrie  HPI: Alex Lee is a 48 y.o. male with HTN, tobacco use and systolic HF due to NICM.  Presented June 2021 with acute HF.  Echo EF 25-30% with G2DD.  Coronary CTA was negative for CAD; calcium score of 0. Negative sleep study 11/21. He saw Dr. Scarce in September 2021.  Echo 09/21 showed no improvement in his EF.   Bedside echo 3/22 EF 55%. cMRI 08/22 with LVEF 45%, RVEF 52%, versy small area of subendocardial LGE in basal inferoseptal wall (can be seen in sarcoidosis but very small).   Saw Dr. Scarce, 11/22, Off all meds. Restarted carvedilol  and spironolactone . Referred back to [facility] to assess whether or not further workup needed to r/o sarcoidosis.  Had repeat cMRI 5/23 EF 44% Very small, well-circumscribed area of subendocardial LGE in the basal inferoseptal wall. No change. Studies were reviewed by Dr. Cherrie, who felt small area of LGE may be possible old isolated sarcoid lesion but no evidence of active disease or other pathology. Felt no role for biopsy or PET scan.   Lost to follow up since 02/2023. Follow up 05/16/24, off all meds and BP elevated. Meds restarted.   Today he returns for HF follow up. Overall feeling fine. He has SOB if he rushes, otherwise no undue dyspnea. He works as a location manager at [employer]. Denies palpitations, abnormal bleeding, CP, dizziness, edema, or PND/Orthopnea. Appetite ok. Weight at home 204-205 pounds. Taking all medications. Smokes 1/2 ppd, no ETOH or drug use.  No significant FHx HF   Cardiac Studies: - Echo (6/21): EF 25-30% G2DD - CTA (6/21): negative for CAD - Echo (9/21): EF 25-30% - Bedside echo (3/22) in clinic EF 55% (EF fully recovered)  - cMRI (02/21/21): EF 45% RVEF 52% small area of LGE in basilar inferoseptal wall  - cMRI (5/23): 5/23 EF 44% Very small, well-circumscribed  area of subendocardial LGE in the basal inferoseptal wall.  Past Medical History:  Diagnosis Date   CHF (congestive heart failure) (HCC)    Childhood asthma    Essential hypertension 03/03/2020   Snoring 03/03/2020   Tobacco abuse    Tobacco abuse 04/08/2020   Current Outpatient Medications  Medication Sig Dispense Refill   albuterol  (VENTOLIN  HFA) 108 (90 Base) MCG/ACT inhaler Inhale 2 puffs into the lungs every 6 (six) hours as needed for wheezing or shortness of breath. 18 g 0   furosemide  (LASIX ) 40 MG tablet Take 1 tablet (40 mg total) by mouth daily. 90 tablet 2   losartan  (COZAAR ) 25 MG tablet Take 1 tablet (25 mg total) by mouth daily. 90 tablet 3   spironolactone  (ALDACTONE ) 25 MG tablet Take 0.5 tablets (12.5 mg total) by mouth daily. 90 tablet 1   carvedilol  (COREG ) 12.5 MG tablet Take 1 tablet (12.5 mg total) by mouth 2 (two) times daily with a meal. (Patient not taking: Reported on 06/02/2024) 60 tablet 6   empagliflozin  (JARDIANCE ) 10 MG TABS tablet Take 1 tablet (10 mg total) by mouth daily before breakfast. (Patient not taking: Reported on 06/02/2024) 30 tablet 11   No current facility-administered medications for this encounter.   No Known Allergies  Social History   Socioeconomic History   Marital status: Married    Spouse name: Not on file   Number of children: Not on file   Years of education: Not on file  Highest education level: Not on file  Occupational History   Not on file  Tobacco Use   Smoking status: Every Day    Current packs/day: 0.50    Types: Cigarettes   Smokeless tobacco: Never  Substance and Sexual Activity   Alcohol use: Yes    Alcohol/week: 10.0 standard drinks of alcohol    Types: 10 Cans of beer per week   Drug use: Not Currently   Sexual activity: Not on file  Other Topics Concern   Not on file  Social History Narrative   Not on file   Social Drivers of Health   Financial Resource Strain: Not on file  Food Insecurity: Not on  file  Transportation Needs: Not on file  Physical Activity: Insufficiently Active (03/24/2020)   Received [facility]   Exercise Vital Sign    On average, how many days per week do you engage in moderate to strenuous exercise (like a brisk walk)?: 7 days    On average, how many minutes do you engage in exercise at this level?: 20 min  Stress: Not on file  Social Connections: Unknown (11/14/2021)   Received from [facility]   Social Network    Social Network: Not on file  Intimate Partner Violence: Unknown (10/14/2021)   Received from [facility]   HITS    Physically Hurt: Not on file    Insult or Talk Down To: Not on file    Threaten Physical Harm: Not on file    Scream or Curse: Not on file   Family History  Problem Relation Age of Onset   Hypertension Mother    Hypertension Father    Wt Readings from Last 3 Encounters:  06/02/24 95 kg (209 lb 6.4 oz)  05/16/24 94 kg (207 lb 3.2 oz)  03/08/23 89.9 kg (198 lb 3.2 oz)   BP (!) 154/104   Pulse 74   Ht 5' 7 (1.702 m)   Wt 95 kg (209 lb 6.4 oz)   SpO2 96%   BMI 32.80 kg/m   PHYSICAL EXAM: General:  NAD. No resp difficulty HEENT: Normal Neck: Supple. No JVD. Cor: Regular rate & rhythm. No rubs, gallops or murmurs. Lungs: Clear Abdomen: Soft, nontender, nondistended.  Extremities: No cyanosis, clubbing, rash, edema Neuro: Alert & oriented x 3, moves all 4 extremities w/o difficulty. Affect pleasant.  ASSESSMENT & PLAN:  1. Chronic systolic HF due to NICM - possible HTN CM - Echo 6/21 EF 25-30% G2DD RV ok - CTA 6/21 no CAD - Echo 9/21 EF 25-30% - There is notation of a small lymph node noted on chest CT but no obvious mediastinal lymphadenopathy that raises suspicion for sarcoidosis.   - Bedside echo 3/22 in clinic EF 55% (EF fully recovered)  - cMRI 02/21/21 EF 45% RVEF 52% small area of LGE in basilar inferoseptal wall  - cMRI reviewed and strongly doubt that LGE represents cardiac sarcoid.  - repeat cMRI  5/23 EF 44% Very small, well-circumscribed area of subendocardial LGE in the basal inferoseptal wall. 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 May have old, isolated sarcoid lesion but no evidence of active disease or other pathology. No role for biopsy or PET scan at this time  - NYHA I-II, volume ok - Start Coreg  6.25 mg bid - Increase spironolactone  to 25 mg daily - Change Lasix  20 mg to PRN - Continue losartan  25 mg daily (  Entresto  too expensive, will have new insurance at first of the year) - Add SGLT2i next visit - discussed importance of maintaining strict compliance  - Repeat echo arranged - Labs today  2. HTN - BP remains elevated (has not had AM meds yet) - Med changes as above - Consider addition of BiDil next - Needs to stop smoking - Given Rx for BP cuff, I asked him to check BP daily and log  3. Tobacco use - smokes 1/2 ppd   - Encouraged cessation  4. Snoring  - Sleep study 11/21 AHI 3.4 (normal)   Follow up in 2 weeks with APP for GDMT titration (add SGLT2i).  Harlene CHRISTELLA Gainer, FNP  9:32 AM

## 2024-06-16 NOTE — Progress Notes (Incomplete)
 ADVANCED HF CLINIC NOTE   Primary Care: Patient, No Pcp Per Primary Cardiologist: Annabella Scarce, MD HF Cardiologist: Dr. Cherrie  HPI: Alex Lee is a 48 y.o. male with HTN, tobacco use and systolic HF due to NICM.  Presented June 2021 with acute HF.  Echo EF 25-30% with G2DD.  Coronary CTA was negative for CAD; calcium score of 0. Negative sleep study 11/21. He saw Dr. Scarce in September 2021.  Echo 09/21 showed no improvement in his EF.   Bedside echo 3/22 EF 55%. cMRI 08/22 with LVEF 45%, RVEF 52%, versy small area of subendocardial LGE in basal inferoseptal wall (can be seen in sarcoidosis but very small).   Saw Dr. Scarce, 11/22, Off all meds. Restarted carvedilol  and spironolactone . Referred back to [facility] to assess whether or not further workup needed to r/o sarcoidosis.  Had repeat cMRI 5/23 EF 44% Very small, well-circumscribed area of subendocardial LGE in the basal inferoseptal wall. No change. Studies were reviewed by Dr. Cherrie, who felt small area of LGE may be possible old isolated sarcoid lesion but no evidence of active disease or other pathology. Felt no role for biopsy or PET scan.   Lost to follow up since 02/2023. Follow up 05/16/24, off all meds and BP elevated. Meds restarted.   Today he returns for AHF follow up. Overall feeling ***. Denies palpitations, CP, dizziness, edema, or PND/Orthopnea. *** SOB. Appetite ok. No fever or chills. Weight at home *** pounds. Taking all medications. Denies ETOH, tobacco or drug use.   No significant FHx HF   Cardiac Studies: - Echo (6/21): EF 25-30% G2DD - CTA (6/21): negative for CAD - Echo (9/21): EF 25-30% - Bedside echo (3/22) in clinic EF 55% (EF fully recovered)  - cMRI (02/21/21): EF 45% RVEF 52% small area of LGE in basilar inferoseptal wall  - cMRI (5/23): 5/23 EF 44% Very small, well-circumscribed area of subendocardial LGE in the basal inferoseptal wall.  Past Medical History:  Diagnosis Date    CHF (congestive heart failure) (HCC)    Childhood asthma    Essential hypertension 03/03/2020   Snoring 03/03/2020   Tobacco abuse    Tobacco abuse 04/08/2020   Current Outpatient Medications  Medication Sig Dispense Refill   albuterol  (VENTOLIN  HFA) 108 (90 Base) MCG/ACT inhaler Inhale 2 puffs into the lungs every 6 (six) hours as needed for wheezing or shortness of breath. 18 g 0   carvedilol  (COREG ) 6.25 MG tablet Take 1 tablet (6.25 mg total) by mouth 2 (two) times daily with a meal. 180 tablet 3   empagliflozin  (JARDIANCE ) 10 MG TABS tablet Take 1 tablet (10 mg total) by mouth daily before breakfast. (Patient not taking: Reported on 06/02/2024) 30 tablet 11   furosemide  (LASIX ) 20 MG tablet Take 1 tablet (20 mg total) by mouth daily as needed for edema or fluid (FOR SWELLING OR WEIGHT GAIN OF 3 POUNDS OVERNIGHT OR 5 POUNDS IN 1 WEEK). 30 tablet 1   losartan  (COZAAR ) 25 MG tablet Take 1 tablet (25 mg total) by mouth daily. 90 tablet 3   spironolactone  (ALDACTONE ) 25 MG tablet Take 1 tablet (25 mg total) by mouth daily. 90 tablet 1   No current facility-administered medications for this visit.   No Known Allergies  Social History   Socioeconomic History   Marital status: Married    Spouse name: Not on file   Number of children: Not on file   Years of education: Not on file  Highest education level: Not on file  Occupational History   Not on file  Tobacco Use   Smoking status: Every Day    Current packs/day: 0.50    Types: Cigarettes   Smokeless tobacco: Never  Substance and Sexual Activity   Alcohol use: Yes    Alcohol/week: 10.0 standard drinks of alcohol    Types: 10 Cans of beer per week   Drug use: Not Currently   Sexual activity: Not on file  Other Topics Concern   Not on file  Social History Narrative   Not on file   Social Drivers of Health   Financial Resource Strain: Not on file  Food Insecurity: Not on file  Transportation Needs: Not on file  Physical  Activity: Insufficiently Active (03/24/2020)   Received from [facility]   Exercise Vital Sign    On average, how many days per week do you engage in moderate to strenuous exercise (like a brisk walk)?: 7 days    On average, how many minutes do you engage in exercise at this level?: 20 min  Stress: Not on file  Social Connections: Unknown (11/14/2021)   Received from [facility]   Social Network    Social Network: Not on file  Intimate Partner Violence: Unknown (10/14/2021)   Received from [facility]   HITS    Physically Hurt: Not on file    Insult or Talk Down To: Not on file    Threaten Physical Harm: Not on file    Scream or Curse: Not on file   Family History  Problem Relation Age of Onset   Hypertension Mother    Hypertension Father    Wt Readings from Last 3 Encounters:  06/02/24 95 kg (209 lb 6.4 oz)  05/16/24 94 kg (207 lb 3.2 oz)  03/08/23 89.9 kg (198 lb 3.2 oz)   There were no vitals taken for this visit.  PHYSICAL EXAM: General:  *** appearing.  No respiratory difficulty Neck: JVD *** cm.  Cor: Regular rate & rhythm. No murmurs. Lungs: clear Extremities: no edema  Neuro: alert & oriented x 3. Affect pleasant.   ASSESSMENT & PLAN: 1. Chronic systolic HF due to NICM - possible HTN CM - Echo 6/21 EF 25-30% G2DD RV ok - CTA 6/21 no CAD - Echo 9/21 EF 25-30% - There is notation of a small lymph node noted on chest CT but no obvious mediastinal lymphadenopathy that raises suspicion for sarcoidosis.   - Bedside echo 3/22 in clinic EF 55% (EF fully recovered)  - cMRI 02/21/21 EF 45% RVEF 52% small area of LGE in basilar inferoseptal wall  - cMRI reviewed and strongly doubt that LGE represents cardiac sarcoid.  - repeat cMRI 5/23 EF 44% Very small, well-circumscribed area of subendocardial LGE in the basal inferoseptal wall. 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 May have old, isolated sarcoid lesion  but no evidence of active disease or other pathology. No role for biopsy or PET scan at this time  - Echo today ***  - NYHA I-II, volume ok - Continue Coreg  6.25 mg bid - Continue spironolactone  25 mg daily - Continue Lasix  20 mg PRN - Continue losartan  25 mg daily (Entresto  too expensive, will have new insurance at first of the year) - Add SGLT2i next visit *** - discussed importance of maintaining strict compliance  - Labs today  2. HTN - BP remains elevated (has  not had AM meds yet) - Med changes as above - Consider addition of BiDil next - Needs to stop smoking *** - Given Rx for BP cuff, I asked him to check BP daily and log ***  3. Tobacco use - smokes 1/2 ppd   - Encouraged cessation  4. Snoring  - Sleep study 11/21 AHI 3.4 (normal)   Follow up in 2 weeks with APP for GDMT titration (add SGLT2i). ***  Beckey LITTIE Coe, NP  4:28 PM

## 2024-06-17 ENCOUNTER — Telehealth (HOSPITAL_COMMUNITY): Payer: Self-pay

## 2024-06-17 NOTE — Telephone Encounter (Signed)
 Called to confirm/remind patient of their appointment at the [facility] on 06/17/2024.   Appointment:   [] Confirmed  [] Left mess   [x] No answer/No voice mail  [] VM Full/unable to leave message  [] Phone not in service  Patient reminded to bring all medications and/or complete list.  Confirmed patient has transportation. Gave directions, instructed to utilize valet parking.

## 2024-06-18 ENCOUNTER — Ambulatory Visit (HOSPITAL_COMMUNITY)

## 2024-06-18 ENCOUNTER — Encounter (HOSPITAL_COMMUNITY): Payer: Self-pay

## 2024-06-18 ENCOUNTER — Ambulatory Visit (HOSPITAL_COMMUNITY): Admission: RE | Admit: 2024-06-18 | Discharge: 2024-06-18 | Attending: Cardiology

## 2024-06-18 ENCOUNTER — Telehealth (HOSPITAL_COMMUNITY): Payer: Self-pay

## 2024-06-18 ENCOUNTER — Ambulatory Visit (HOSPITAL_COMMUNITY): Payer: Self-pay | Admitting: Internal Medicine

## 2024-06-18 ENCOUNTER — Other Ambulatory Visit (HOSPITAL_COMMUNITY): Payer: Self-pay

## 2024-06-18 ENCOUNTER — Ambulatory Visit (HOSPITAL_COMMUNITY): Admission: RE | Admit: 2024-06-18 | Discharge: 2024-06-18 | Attending: Cardiology | Admitting: Cardiology

## 2024-06-18 VITALS — BP 142/102 | HR 72 | Ht 67.0 in | Wt 209.8 lb

## 2024-06-18 DIAGNOSIS — I5042 Chronic combined systolic (congestive) and diastolic (congestive) heart failure: Secondary | ICD-10-CM

## 2024-06-18 DIAGNOSIS — I10 Essential (primary) hypertension: Secondary | ICD-10-CM

## 2024-06-18 DIAGNOSIS — Z7984 Long term (current) use of oral hypoglycemic drugs: Secondary | ICD-10-CM | POA: Insufficient documentation

## 2024-06-18 DIAGNOSIS — Z720 Tobacco use: Secondary | ICD-10-CM | POA: Diagnosis not present

## 2024-06-18 DIAGNOSIS — J45909 Unspecified asthma, uncomplicated: Secondary | ICD-10-CM | POA: Insufficient documentation

## 2024-06-18 DIAGNOSIS — I5022 Chronic systolic (congestive) heart failure: Secondary | ICD-10-CM | POA: Diagnosis not present

## 2024-06-18 DIAGNOSIS — D869 Sarcoidosis, unspecified: Secondary | ICD-10-CM | POA: Insufficient documentation

## 2024-06-18 DIAGNOSIS — I071 Rheumatic tricuspid insufficiency: Secondary | ICD-10-CM | POA: Insufficient documentation

## 2024-06-18 DIAGNOSIS — F17210 Nicotine dependence, cigarettes, uncomplicated: Secondary | ICD-10-CM | POA: Insufficient documentation

## 2024-06-18 DIAGNOSIS — Z79899 Other long term (current) drug therapy: Secondary | ICD-10-CM | POA: Insufficient documentation

## 2024-06-18 DIAGNOSIS — I110 Hypertensive heart disease with heart failure: Secondary | ICD-10-CM | POA: Insufficient documentation

## 2024-06-18 DIAGNOSIS — I358 Other nonrheumatic aortic valve disorders: Secondary | ICD-10-CM | POA: Insufficient documentation

## 2024-06-18 DIAGNOSIS — I428 Other cardiomyopathies: Secondary | ICD-10-CM | POA: Insufficient documentation

## 2024-06-18 LAB — BASIC METABOLIC PANEL WITH GFR
Anion gap: 9 (ref 5–15)
BUN: 12 mg/dL (ref 6–20)
CO2: 22 mmol/L (ref 22–32)
Calcium: 9.2 mg/dL (ref 8.9–10.3)
Chloride: 107 mmol/L (ref 98–111)
Creatinine, Ser: 0.87 mg/dL (ref 0.61–1.24)
GFR, Estimated: >60 mL/min (ref >60)
Glucose, Bld: 126 mg/dL — ABNORMAL HIGH (ref 70–99)
Potassium: 4 mmol/L (ref 3.5–5.1)
Sodium: 138 mmol/L (ref 135–145)

## 2024-06-18 LAB — BRAIN NATRIURETIC PEPTIDE: B Natriuretic Peptide: 8.9 pg/mL (ref 0.0–100.0)

## 2024-06-18 LAB — ECHOCARDIOGRAM COMPLETE
AR max vel: 1.93 cm2
AV Area VTI: 1.82 cm2
AV Area mean vel: 1.86 cm2
AV Mean grad: 5 mmHg
AV Peak grad: 7.7 mmHg
Ao pk vel: 1.39 m/s
Area-P 1/2: 2.48 cm2
S' Lateral: 4.2 cm

## 2024-06-18 MED ORDER — EMPAGLIFLOZIN 10 MG PO TABS: 10.0000 mg | ORAL_TABLET | Freq: Every day | ORAL | 11 refills | Status: DC

## 2024-06-18 MED ORDER — CARVEDILOL 12.5 MG PO TABS: 12.5000 mg | ORAL_TABLET | Freq: Two times a day (BID) | ORAL | 5 refills | Status: AC

## 2024-06-18 NOTE — Patient Instructions (Addendum)
 Medication Changes:  START JARDIANCE  10MG  ONCE DAILY   INCREASE CARVEDILOL  TO 12.5MG  TWICE DAILY   Lab Work:  Labs done today, your results will be available in MyChart, we will contact you for abnormal readings.  Follow-Up in: 2 MONTHS WITH APP AS SCHEDULED   At the [facility], you and your health needs are our priority. We have a designated team specialized in the treatment of Heart Failure. This Care Team includes your primary Heart Failure Specialized Cardiologist (physician), Advanced Practice Providers (APPs- Physician Assistants and Nurse Practitioners), and Pharmacist who all work together to provide you with the care you need, when you need it.   You may see any of the following providers on your designated Care Team at your next follow up:  Dr. Toribio Fuel Dr. Ezra Shuck Dr. Odis Brownie Greig Mosses, NP Caffie Shed, [place] [facility] [place], [place] Beckey Coe, NP Jordan Lee, NP Tinnie Redman, PharmD   Please be sure to bring in all your medications bottles to every appointment.   Need to Contact Us :  If you have any questions or concerns before your next appointment please send us  a message through [place] or call our office at [phone].    TO LEAVE A MESSAGE FOR THE NURSE SELECT OPTION 2, PLEASE LEAVE A MESSAGE INCLUDING: YOUR NAME DATE OF BIRTH CALL BACK NUMBER REASON FOR CALL**this is important as we prioritize the call backs  YOU WILL RECEIVE A CALL BACK THE SAME DAY AS LONG AS YOU CALL BEFORE 4:00 PM

## 2024-06-18 NOTE — Telephone Encounter (Signed)
 Advanced Heart Failure Patient Advocate Encounter  Test billing for this patient's current coverage [employer]) returns a [id number] copay for 30 day supply of Jardiance . This plan has an ummet $1650 deductible.  [employer] has generic Farxiga for $390.18, which is also likely unaffordable to patient at this time, and would not work towards meeting deductible.  Patient is eligible to use copay savings card, which has a max benefit of $175, so patient would still need to pay $433.68 for this fill.  This test claim was processed through [facility]- copay amounts may vary at other pharmacies due to pharmacy/plan contracts, or as the patient moves through the different stages of their insurance plan.  Rachel DEL, CPhT Rx Patient Advocate Phone: [phone]

## 2024-06-18 NOTE — Progress Notes (Signed)
 ADVANCED HF CLINIC NOTE   Primary Care: Patient, No Pcp Per Primary Cardiologist: Annabella Scarce, MD HF Cardiologist: Dr. Cherrie  HPI: Alex Lee is a 48 y.o. male with HTN, tobacco use and systolic HF due to NICM.  Presented June 2021 with acute HF.  Echo EF 25-30% with G2DD.  Coronary CTA was negative for CAD; calcium score of 0. Negative sleep study 11/21. He saw Dr. Scarce in September 2021.  Echo 09/21 showed no improvement in his EF.   Bedside echo 3/22 EF 55%. cMRI 08/22 with LVEF 45%, RVEF 52%, versy small area of subendocardial LGE in basal inferoseptal wall (can be seen in sarcoidosis but very small).   Saw Dr. Scarce, 11/22, Off all meds. Restarted carvedilol  and spironolactone . Referred back to [facility] to assess whether or not further workup needed to r/o sarcoidosis.  Had repeat cMRI 5/23 EF 44% Very small, well-circumscribed area of subendocardial LGE in the basal inferoseptal wall. No change. Studies were reviewed by Dr. Cherrie, who felt small area of LGE may be possible old isolated sarcoid lesion but no evidence of active disease or other pathology. Felt no role for biopsy or PET scan.   Lost to follow up since 02/2023. Follow up 05/16/24, off all meds and BP elevated. Meds restarted.   Today he returns for AHF follow up with his wife. Overall feeling ok. Denies palpitations, CP, dizziness, edema, or PND/Orthopnea. SOB very minimal. Appetite ok, tries to watch what he eats. No fever or chills. Weight at home 204-205 pounds. Taking all medications. Denies ETOH or drug use. Smokes about 1/2 PPD.  SBP at home 120s-150s (log reviewed).   No significant FHx HF   Cardiac Studies: - Echo today pending read.  - cMRI 5/23: EF 44%, RVEF 52% - Echo (6/21): EF 25-30% G2DD - CTA (6/21): negative for CAD - Echo (9/21): EF 25-30% - Bedside echo (3/22) in clinic EF 55% (EF fully recovered)  - cMRI (02/21/21): EF 45% RVEF 52% small area of LGE in basilar inferoseptal wall   - cMRI (5/23): 5/23 EF 44% Very small, well-circumscribed area of subendocardial LGE in the basal inferoseptal wall.  Past Medical History:  Diagnosis Date   CHF (congestive heart failure) (HCC)    Childhood asthma    Essential hypertension 03/03/2020   Snoring 03/03/2020   Tobacco abuse    Tobacco abuse 04/08/2020   Current Outpatient Medications  Medication Sig Dispense Refill   albuterol  (VENTOLIN  HFA) 108 (90 Base) MCG/ACT inhaler Inhale 2 puffs into the lungs every 6 (six) hours as needed for wheezing or shortness of breath. 18 g 0   carvedilol  (COREG ) 6.25 MG tablet Take 1 tablet (6.25 mg total) by mouth 2 (two) times daily with a meal. 180 tablet 3   furosemide  (LASIX ) 20 MG tablet Take 1 tablet (20 mg total) by mouth daily as needed for edema or fluid (FOR SWELLING OR WEIGHT GAIN OF 3 POUNDS OVERNIGHT OR 5 POUNDS IN 1 WEEK). 30 tablet 1   losartan  (COZAAR ) 25 MG tablet Take 1 tablet (25 mg total) by mouth daily. 90 tablet 3   spironolactone  (ALDACTONE ) 25 MG tablet Take 1 tablet (25 mg total) by mouth daily. 90 tablet 1   empagliflozin  (JARDIANCE ) 10 MG TABS tablet Take 1 tablet (10 mg total) by mouth daily before breakfast. (Patient not taking: Reported on 06/18/2024) 30 tablet 11   No current facility-administered medications for this encounter.   No Known Allergies  Social History  Socioeconomic History   Marital status: Married    Spouse name: Not on file   Number of children: Not on file   Years of education: Not on file   Highest education level: Not on file  Occupational History   Not on file  Tobacco Use   Smoking status: Every Day    Current packs/day: 0.50    Types: Cigarettes   Smokeless tobacco: Never  Substance and Sexual Activity   Alcohol use: Yes    Alcohol/week: 10.0 standard drinks of alcohol    Types: 10 Cans of beer per week   Drug use: Not Currently   Sexual activity: Not on file  Other Topics Concern   Not on file  Social History Narrative    Not on file   Social Drivers of Health   Financial Resource Strain: Not on file  Food Insecurity: Not on file  Transportation Needs: Not on file  Physical Activity: Insufficiently Active (03/24/2020)   Received from [facility]   Exercise Vital Sign    On average, how many days per week do you engage in moderate to strenuous exercise (like a brisk walk)?: 7 days    On average, how many minutes do you engage in exercise at this level?: 20 min  Stress: Not on file  Social Connections: Unknown (11/14/2021)   Received from [facility]   Social Network    Social Network: Not on file  Intimate Partner Violence: Unknown (10/14/2021)   Received from [facility]   HITS    Physically Hurt: Not on file    Insult or Talk Down To: Not on file    Threaten Physical Harm: Not on file    Scream or Curse: Not on file   Family History  Problem Relation Age of Onset   Hypertension Mother    Hypertension Father    Wt Readings from Last 3 Encounters:  06/18/24 95.2 kg (209 lb 12.8 oz)  06/02/24 95 kg (209 lb 6.4 oz)  05/16/24 94 kg (207 lb 3.2 oz)   BP (!) 142/102   Pulse 72   Ht 5' 7 (1.702 m)   Wt 95.2 kg (209 lb 12.8 oz)   SpO2 96%   BMI 32.86 kg/m   PHYSICAL EXAM: General:  well appearing.  No respiratory difficulty. Walked into clinic Neck: JVD flat.  Cor: Regular rate & rhythm. No murmurs. Lungs: clear Extremities: no edema  Neuro: alert & oriented x 3. Affect pleasant.   ASSESSMENT & PLAN: 1. Chronic systolic HF due to NICM - possible HTN CM - Echo 6/21 EF 25-30% G2DD RV ok - CTA 6/21 no CAD - Echo 9/21 EF 25-30% - There is notation of a small lymph node noted on chest CT but no obvious mediastinal lymphadenopathy that raises suspicion for sarcoidosis.   - Bedside echo 3/22 in clinic EF 55% (EF fully recovered)  - cMRI 02/21/21 EF 45% RVEF 52% small area of LGE in basilar inferoseptal wall  - cMRI reviewed and strongly doubt that LGE represents cardiac sarcoid.  -  repeat cMRI 5/23 EF 44% Very small, well-circumscribed area of subendocardial LGE in the basal inferoseptal wall. 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 May have old, isolated sarcoid lesion but no evidence of active disease or other pathology. No role for biopsy or PET scan at this time  - Echo today official read pending.   - NYHA I-II,  volume ok - Increase Coreg  6.25>12.5 mg bid - Continue spironolactone  25 mg daily - Continue Lasix  20 mg PRN - Continue losartan  25 mg daily (Entresto  too expensive, will have new insurance at first of the year) - I was going to start Jardiance  today but discussed with pharmacy team and after co-pay card patient would have to pay 400$ out of pocket for 30 day supply (high-deductible plan). Doreen not covered at all. Does not qualify for patient assistance.  - Discussed importance of maintaining strict compliance  - Labs today  2. HTN - BP elevated today. Checks regularly at home, 120s-150s.  - Med changes as above - Consider addition of BiDil next - Needs to stop smoking   3. Tobacco use - smokes 1/2 ppd   - Encouraged cessation  4. Snoring  - Sleep study 11/21 AHI 3.4 (normal)   Follow up in 2 months with APP.   Beckey LITTIE Coe, NP  10:17 AM

## 2024-06-23 ENCOUNTER — Telehealth (HOSPITAL_COMMUNITY): Payer: Self-pay | Admitting: *Deleted

## 2024-06-23 NOTE — Telephone Encounter (Signed)
 Called patient per Caffie Shed, PA with following echo results:  Pump function of heart remains reduced, EF 35-40%. Continue current medications.   Pt verbalized understanding of same. No further questions at this time.

## 2024-07-28 ENCOUNTER — Other Ambulatory Visit (HOSPITAL_COMMUNITY): Payer: Self-pay

## 2024-07-28 DIAGNOSIS — I5042 Chronic combined systolic (congestive) and diastolic (congestive) heart failure: Secondary | ICD-10-CM

## 2024-07-28 MED ORDER — FUROSEMIDE 20 MG PO TABS: 20.0000 mg | ORAL_TABLET | Freq: Every day | ORAL | 5 refills | Status: AC | PRN

## 2024-09-16 ENCOUNTER — Ambulatory Visit (HOSPITAL_COMMUNITY)
